# Patient Record
Sex: Female | Born: 1965 | Race: Black or African American | Hispanic: No | Smoking: Former smoker
Health system: Southern US, Community
[De-identification: ages and names within clinical notes are randomized; demographics above are authoritative.]

## PROBLEM LIST (undated history)

## (undated) DIAGNOSIS — T7840XA Allergy, unspecified, initial encounter: Secondary | ICD-10-CM

## (undated) DIAGNOSIS — M1990 Unspecified osteoarthritis, unspecified site: Secondary | ICD-10-CM

## (undated) DIAGNOSIS — F419 Anxiety disorder, unspecified: Secondary | ICD-10-CM

## (undated) DIAGNOSIS — IMO0002 Reserved for concepts with insufficient information to code with codable children: Secondary | ICD-10-CM

## (undated) DIAGNOSIS — M5136 Other intervertebral disc degeneration, lumbar region: Secondary | ICD-10-CM

## (undated) DIAGNOSIS — M797 Fibromyalgia: Secondary | ICD-10-CM

## (undated) HISTORY — DX: Other intervertebral disc degeneration, lumbar region: M51.36

## (undated) HISTORY — DX: Unspecified osteoarthritis, unspecified site: M19.90

## (undated) HISTORY — DX: Allergy, unspecified, initial encounter: T78.40XA

## (undated) HISTORY — DX: Anxiety disorder, unspecified: F41.9

## (undated) HISTORY — PX: COLONOSCOPY: SHX174

## (undated) HISTORY — PX: BUNIONECTOMY: SHX129

## (undated) HISTORY — PX: OTHER SURGICAL HISTORY: SHX169

## (undated) HISTORY — DX: Fibromyalgia: M79.7

## (undated) HISTORY — DX: Reserved for concepts with insufficient information to code with codable children: IMO0002

## (undated) HISTORY — PX: REDUCTION MAMMAPLASTY: SUR839

## (undated) HISTORY — PX: WISDOM TOOTH EXTRACTION: SHX21

## (undated) HISTORY — PX: TRIGGER FINGER RELEASE: SHX641

---
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
## 1983-06-08 DIAGNOSIS — IMO0002 Reserved for concepts with insufficient information to code with codable children: Secondary | ICD-10-CM

## 1983-06-08 HISTORY — DX: Reserved for concepts with insufficient information to code with codable children: IMO0002

## 1987-06-08 HISTORY — PX: TUBAL LIGATION: SHX77

## 1996-06-07 HISTORY — PX: BREAST REDUCTION SURGERY: SHX8

## 1996-06-07 HISTORY — PX: COSMETIC SURGERY: SHX468

## 2001-06-07 DIAGNOSIS — M797 Fibromyalgia: Secondary | ICD-10-CM

## 2001-06-07 HISTORY — DX: Fibromyalgia: M79.7

## 2002-06-07 HISTORY — PX: ABDOMINAL HYSTERECTOMY: SHX81

## 2013-12-06 ENCOUNTER — Other Ambulatory Visit: Payer: Self-pay | Admitting: Family Medicine

## 2013-12-06 DIAGNOSIS — Z1231 Encounter for screening mammogram for malignant neoplasm of breast: Secondary | ICD-10-CM

## 2013-12-20 ENCOUNTER — Ambulatory Visit: Payer: Self-pay

## 2014-01-09 ENCOUNTER — Ambulatory Visit
Admission: RE | Admit: 2014-01-09 | Discharge: 2014-01-09 | Disposition: A | Discharge status: Home / Self Care | Payer: Medicare HMO | Source: Ambulatory Visit | Attending: Family Medicine | Admitting: Family Medicine

## 2014-01-09 DIAGNOSIS — Z1231 Encounter for screening mammogram for malignant neoplasm of breast: Secondary | ICD-10-CM

## 2014-01-10 ENCOUNTER — Encounter: Payer: Self-pay | Admitting: Physician Assistant

## 2014-02-06 ENCOUNTER — Encounter: Payer: Self-pay | Admitting: Physician Assistant

## 2014-02-06 ENCOUNTER — Ambulatory Visit (INDEPENDENT_AMBULATORY_CARE_PROVIDER_SITE_OTHER): Payer: Medicare HMO | Admitting: Physician Assistant

## 2014-02-06 VITALS — BP 96/62 | HR 74 | Temp 98.2°F | Resp 14 | Ht 66.0 in | Wt 196.0 lb

## 2014-02-06 DIAGNOSIS — M5030 Other cervical disc degeneration, unspecified cervical region: Secondary | ICD-10-CM

## 2014-02-06 DIAGNOSIS — M5137 Other intervertebral disc degeneration, lumbosacral region: Secondary | ICD-10-CM

## 2014-02-06 DIAGNOSIS — K219 Gastro-esophageal reflux disease without esophagitis: Secondary | ICD-10-CM | POA: Insufficient documentation

## 2014-02-06 DIAGNOSIS — IMO0002 Reserved for concepts with insufficient information to code with codable children: Secondary | ICD-10-CM

## 2014-02-06 DIAGNOSIS — M5134 Other intervertebral disc degeneration, thoracic region: Secondary | ICD-10-CM | POA: Insufficient documentation

## 2014-02-06 DIAGNOSIS — Z9109 Other allergy status, other than to drugs and biological substances: Secondary | ICD-10-CM

## 2014-02-06 DIAGNOSIS — M1710 Unilateral primary osteoarthritis, unspecified knee: Secondary | ICD-10-CM

## 2014-02-06 DIAGNOSIS — Z889 Allergy status to unspecified drugs, medicaments and biological substances status: Secondary | ICD-10-CM | POA: Insufficient documentation

## 2014-02-06 DIAGNOSIS — M5136 Other intervertebral disc degeneration, lumbar region: Secondary | ICD-10-CM

## 2014-02-06 DIAGNOSIS — K5909 Other constipation: Secondary | ICD-10-CM

## 2014-02-06 DIAGNOSIS — K5900 Constipation, unspecified: Secondary | ICD-10-CM

## 2014-02-06 DIAGNOSIS — M1712 Unilateral primary osteoarthritis, left knee: Secondary | ICD-10-CM

## 2014-02-06 DIAGNOSIS — M1610 Unilateral primary osteoarthritis, unspecified hip: Secondary | ICD-10-CM

## 2014-02-06 DIAGNOSIS — M160 Bilateral primary osteoarthritis of hip: Secondary | ICD-10-CM

## 2014-02-06 HISTORY — DX: Other intervertebral disc degeneration, lumbar region: M51.36

## 2014-02-06 NOTE — Progress Notes (Signed)
Patient ID: Candice Shannon MRN: [id number], DOB: 1965/12/27, 48 y.o. Date of Encounter: @  Chief Complaint:  Chief Complaint  Patient presents with  . New pt - establish     HPI: 48 y.o. year old AA female  presents as a new patient to establish care with [place].  She reports that she is living in [place]. For the past 6 years she was back and forth between [place] and [place]. Her parents were living in [place]. She was going there to help care for her father who passed away in 2012/11/11. Since he passed away, patient and her mother have moved here. She says that she is living in an apartment with her mother. She says that she has an aunt who lives here. The patient has one child--- a daughter who has 5 children and one on the way. Patient states that the daughter and grandchildren are planning to move here in the next year.  When asked patient if she works she says " no", that she is "on disability".  Since moving to this area, she did go to see Dr. Manuela Neptune at [facility] for one visit on 12/06/13. She says that she is having to find a different primary care provider because of insurance.  At that visit with Dr.Jones, he did complete physical exam including full panel of lab work. Also he did x-ray of the hip and knee because she was complaining of left knee pain and right hip pain. Today patient did bring in a copy of that office visit note but we do not have a copy of the x-ray reports or the lab results. However the note does report that he was going to do CBC, CMP, lipid panel, TSH.  She also brings in a copy of the allergy testing done at the family urgent and family care. This shows positive results for multiple types of grasses, trees et Karie Soda.  She also brought in copy of office note with Dr. Elnoria Howard 01/09/14. She states that she did have colonoscopy with him. Was told to followup 10 years. I do not have the actual colonoscopy report.  Dr. Haywood Pao office  visit note does not provide much history or information regarding these diagnoses but does list other diagnoses for his visit as GERD and constipation.  She also brings in copy of recent mammogram done 01/11/14. This was negative and followup in 1 year.  She has no complaints or concerns to address today. Says that she simply wanted to establish care here.   Past Medical History  Diagnosis Date  . Allergy 1970  . Anxiety 1975  . Fibromyalgia 2003  . Arthritis 1977  . Ulcer 06/08/1983    Gastric Ulcer  . Degenerative disc disease, lumbar 02/06/2014     Home Meds: Outpatient Prescriptions Prior to Visit  Medication Sig Dispense Refill  . diphenhydrAMINE (BENADRYL) 25 MG tablet Take 25 mg by mouth every 6 (six) hours as needed.      . meloxicam (MOBIC) 15 MG tablet Take 15 mg by mouth daily.       No facility-administered medications prior to visit.    Allergies:  Allergies  Allergen Reactions  . Aspirin Nausea Only    History   Social History  . Marital Status: Divorced    Spouse Name: N/A    Number of Children: N/A  . Years of Education: N/A   Occupational History  . Not on file.   Social History  Main Topics  . Smoking status: Former Smoker    Quit date: 06/07/1986  . Smokeless tobacco: Never Used  . Alcohol Use: 1.1 oz/week    1 Glasses of wine, 1 Drinks containing 0.5 oz of alcohol per week  . Drug Use: No  . Sexual Activity: No   Other Topics Concern  . Not on file   Social History Narrative  . No narrative on file    Family History  Problem Relation Age of Onset  . Arthritis Mother   . Miscarriages / [place] Mother   . Diabetes Father   . Heart disease Father   . Hyperlipidemia Father   . COPD Maternal Grandmother   . Emphysema Maternal Grandmother   . Diabetes Paternal Grandmother   . Heart disease Paternal Grandmother   . Hyperlipidemia Paternal Grandmother   . Drug abuse Brother   . Diabetes Brother      Review of Systems:  See HPI for  pertinent ROS. All other ROS negative.    Physical Exam: Blood pressure 96/62, pulse 74, temperature 98.2 F (36.8 C), temperature source Oral, resp. rate 14, height  (1.676 m), weight 196 lb (88.905 kg)., Body mass index is 31.65 kg/(m^2). General: Overweight AAF. Appears in no acute distress. Neck: Supple. No thyromegaly. No lymphadenopathy. No carotid bruit. Lungs: Clear bilaterally to auscultation without wheezes, rales, or rhonchi. Breathing is unlabored. Heart: RRR with S1 S2. No murmurs, rubs, or gallops. Abdomen: Soft, non-tender, non-distended with normoactive bowel sounds. No hepatomegaly. No rebound/guarding. No obvious abdominal masses. Musculoskeletal:  Strength and tone normal for age. Extremities/Skin: Warm and dry.  No edema.  Neuro: Alert and oriented X 3. Moves all extremities spontaneously. Gait is normal. CNII-XII grossly in tact. Psych:  Responds to questions appropriately with a normal affect.     ASSESSMENT AND PLAN:  48 y.o. year old female with  1. Degenerative disc disease, cervical She reports that she has degenerative disc disease and chronic pain in her neck. This often causes severe headaches.  2. Degenerative disc disease, lumbar She says that she has ruptured disc in her lumbar spine. Says that in the past she did physical therapy.  3. Degenerative disc disease, thoracic She says that she has "compressed vertebrae" in her thoracic spine.  4. Arthritis of both hips She had recent x-rays by Dr. Knox Royalty. I do not have the report. He says that she was told that showed arthritis.  5. Arthritis of knee, left She had recent x-ray but Dr. Knox Royalty. I do not have report. She says that she was told it showed arthritis.  6. Gastroesophageal reflux disease, esophagitis presence not specified She says that in the past taking medications for this did not help much and seemed to just make matters worse. He says that she controls this is managing her  diet and avoiding certain foods.  7. Chronic constipation Dr. Elnoria Howard discussed this with her and she deferred any medication for this.  8. Multiple allergies She brought in copy of allergy testing done at the urgent and family care and it showed positive allergies to multiple grasses trees and cats and dogs et Karie Soda.  Today I had her sign a release for [place] to try to get copies of the lab results done at the family in urgent care She can wait one year for follow up for complete physical exam and otherwise can followup as needed in the interim.   [address] [place], [place], [facility] 02/06/2014 2:45 PM

## 2014-03-06 ENCOUNTER — Ambulatory Visit: Payer: Medicare HMO | Admitting: Physician Assistant

## 2014-03-08 ENCOUNTER — Encounter: Payer: Self-pay | Admitting: Family Medicine

## 2014-03-08 ENCOUNTER — Ambulatory Visit (INDEPENDENT_AMBULATORY_CARE_PROVIDER_SITE_OTHER): Payer: Medicare HMO | Admitting: Family Medicine

## 2014-03-08 VITALS — BP 98/58 | HR 80 | Temp 97.8°F | Resp 14 | Ht 66.0 in | Wt 197.0 lb

## 2014-03-08 DIAGNOSIS — N951 Menopausal and female climacteric states: Secondary | ICD-10-CM

## 2014-03-08 NOTE — Progress Notes (Signed)
   Subjective:    Patient ID: Candice Shannon, female    DOB: 04/02/1966, 48 y.o.   MRN: [id number]  HPI Patient is here today requesting laboratory assessment her hormone levels. Patient has a history of an abdominal hysterectomy however she still has her ovaries. She reports that she's been having hot flashes for several years. She attributes her difficulty losing weight possible hormone fluctuations in the body. She states that she is exercising on a daily basis burning 400-500 calories per day. She is restricting her diet to less than 1400 calories per day. Reportedly she has been unable to achieve any weight loss using these measures. I reviewed lab work obtained in urgent care in July which showed a normal TSH. Past Medical History  Diagnosis Date  . Allergy 1970  . Anxiety 1975  . Fibromyalgia 2003  . Arthritis 1977  . Ulcer 06/08/1983    Gastric Ulcer  . Degenerative disc disease, lumbar 02/06/2014   Past Surgical History  Procedure Laterality Date  . Cosmetic surgery Bilateral 1998    breast reduction  . Bunions Bilateral 1983 and 1993    both feet removed twice  . Bunionectomy Bilateral 1983, 1993  . Tubal ligation  06/08/1987  . Abdominal hysterectomy  2004    ovaries are not taken   No current outpatient prescriptions on file prior to visit.   No current facility-administered medications on file prior to visit.   Allergies  Allergen Reactions  . Aspirin Nausea Only   History   Social History  . Marital Status: Divorced    Spouse Name: N/A    Number of Children: N/A  . Years of Education: N/A   Occupational History  . Not on file.   Social History Main Topics  . Smoking status: Former Smoker    Quit date: 06/07/1986  . Smokeless tobacco: Never Used  . Alcohol Use: 1.1 oz/week    1 Glasses of wine, 1 Drinks containing 0.5 oz of alcohol per week  . Drug Use: No  . Sexual Activity: No   Other Topics Concern  . Not on file   Social History Narrative  . No  narrative on file      Review of Systems  All other systems reviewed and are negative.      Objective:   Physical Exam  Vitals reviewed. Cardiovascular: Normal rate and regular rhythm.   Pulmonary/Chest: Effort normal and breath sounds normal.          Assessment & Plan:  Menopause syndrome - Plan: Follicle stimulating hormone, Luteinizing hormone, Estrogens, total  I will gladly check the patient's FSH, LH, and estrogen levels. I explained to the patient that I do not believe estrogen deficiency as the cause of her weight issues. However if the patient has intermittent positives are tried hormone replacement therapy for hot flashes as well as some of the vasomotor symptoms.  We also discussed medications to help with weight loss including adipex, orlistat, belviq, and contrave.  She is interested in trying belviq.

## 2014-03-09 LAB — FOLLICLE STIMULATING HORMONE: FSH: 82.6 m[IU]/mL

## 2014-03-09 LAB — LUTEINIZING HORMONE: LH: 36.1 m[IU]/mL

## 2014-03-11 LAB — ESTROGENS, TOTAL: Estrogen: 48 pg/mL

## 2014-04-08 ENCOUNTER — Telehealth: Payer: Self-pay | Admitting: Physician Assistant

## 2014-04-08 NOTE — Telephone Encounter (Signed)
Patient would like rx for pain meds for her fibromyalgia if possible  Does not want to take lyrica  [phone] Would like neurotin if possible

## 2014-04-08 NOTE — Telephone Encounter (Signed)
Pt only been seen here twice.  Not on nay meds for fibromyalgia.  Called patient and had her schedule appt with provider.

## 2014-04-10 ENCOUNTER — Encounter: Payer: Self-pay | Admitting: Physician Assistant

## 2014-04-10 ENCOUNTER — Ambulatory Visit (INDEPENDENT_AMBULATORY_CARE_PROVIDER_SITE_OTHER): Payer: Medicare HMO | Admitting: Physician Assistant

## 2014-04-10 VITALS — BP 104/76 | HR 76 | Temp 98.2°F | Resp 18 | Wt 198.0 lb

## 2014-04-10 DIAGNOSIS — M5136 Other intervertebral disc degeneration, lumbar region: Secondary | ICD-10-CM

## 2014-04-10 DIAGNOSIS — M129 Arthropathy, unspecified: Secondary | ICD-10-CM

## 2014-04-10 DIAGNOSIS — M160 Bilateral primary osteoarthritis of hip: Secondary | ICD-10-CM

## 2014-04-10 DIAGNOSIS — M51369 Other intervertebral disc degeneration, lumbar region without mention of lumbar back pain or lower extremity pain: Secondary | ICD-10-CM

## 2014-04-10 DIAGNOSIS — M797 Fibromyalgia: Secondary | ICD-10-CM

## 2014-04-10 DIAGNOSIS — M1712 Unilateral primary osteoarthritis, left knee: Secondary | ICD-10-CM

## 2014-04-10 DIAGNOSIS — M5030 Other cervical disc degeneration, unspecified cervical region: Secondary | ICD-10-CM

## 2014-04-10 DIAGNOSIS — M5134 Other intervertebral disc degeneration, thoracic region: Secondary | ICD-10-CM

## 2014-04-10 MED ORDER — DULOXETINE HCL 60 MG PO CPEP: 60.0000 mg | ORAL_CAPSULE | Freq: Every day | ORAL | Status: DC

## 2014-04-10 MED ORDER — GABAPENTIN 300 MG PO CAPS: 300.0000 mg | ORAL_CAPSULE | Freq: Three times a day (TID) | ORAL | Status: DC

## 2014-04-10 NOTE — Progress Notes (Signed)
Patient ID: Candice Shannon MRN: [id number], DOB: 10/12/1965, 48 y.o. Date of Encounter: @DATE @  Chief Complaint:  Chief Complaint  Patient presents with  . Arthritis and Fibromyalgia    HPI: 48 y.o. year old AA female  Presents   She was seen by me on 02/06/2014  as a new patient to establish care with our office.  At that visit she reported:  She reported that [place] living in [place]. For the past 6 years [place]. Her parents were living in [place]. She was going there to help care for her father who passed away in June 2014.  Since he passed away, patient and her mother have moved here. She says that she is living in an apartment with her mother. She says that she has an aunt who lives here. The patient has one child--- a daughter who has 5 children and one on the way. Patient states that the daughter and grandchildren are planning to move here in the next year.  When asked patient if she works she said " no", that she is "on disability". Says she is on Disability for her fibromyalgia.  Since moving to this area, she did go [facility]rico [facility] one visit on 12/06/13. She says that she is having to find a different primary care provider because of insurance.  At that visit with Dr.Jones, he did complete physical exam including full panel of lab work. Also he did x-ray of the hip and knee because she was complaining of left knee pain and right hip pain. Today patient did bring in a copy of that office visit note but we do not have a copy of the x-ray reports or the lab results. However the note does report that he was going to do CBC, CMP, lipid panel, TSH.  At the initial OV she also brought in a copy of the allergy testing done at the family urgent and family care. This shows positive results for multiple types of grasses, trees et Karie Sodacetera.  She also brought in copy of office note with Dr. Elnoria HowardHung  01/09/14. She states that she did have colonoscopy with him. Was told to followup 10 years. I do not have the actual colonoscopy report.  Dr. Haywood PaoHung's office visit note does not provide much history or information regarding these diagnoses but does list other diagnoses for his visit as GERD and constipation.  She also brings in copy of recent mammogram done 01/11/14. This was negative and followup in 1 year.  On 02/06/2014 she had no complaints or concerns to address.  That day she simply wanted to establish care here.  TODAY---04/10/2014: Today she says that she is here to get on some medication for her fibromyalgia and arthritis. Says that some days she has to take 5 or 6 Naprosyn to try to control the pain even though she knows that isn't really safe. Says that she has been on multiple medicines for fibromyalgia in the past. Can recall being on gabapentin. Also says that she was on Lyrica for about 10 years but says that she does not want to restart that medicine. Also says that she was on Celebrex at one point. Says that she has recently been using her cousins diclofenac and gabapentin. Is interested in getting prescription for gabapentin. Says that Dr. Manuela NeptuneHenrico Jones had prescribed meloxicam but that that she saw no relief with that.  Past Medical History  Diagnosis Date  . Allergy 1970  . Anxiety 1975  . Fibromyalgia 2003  . Arthritis 1977  . Ulcer 06/08/1983    Gastric Ulcer  . Degenerative disc disease, lumbar 02/06/2014     Home Meds: No outpatient prescriptions prior to visit.   No facility-administered medications prior to visit.    Allergies:  Allergies  Allergen Reactions  . Aspirin Nausea Only    History   Social History  . Marital Status: Divorced    Spouse Name: N/A    Number of Children: N/A  . Years of Education: N/A   Occupational History  . Not on file.   Social History Main Topics  . Smoking status: Former Smoker    Quit date: 06/07/1986  . Smokeless  tobacco: Never Used  . Alcohol Use: 1.1 oz/week    1 Glasses of wine, 1 Not specified per week  . Drug Use: No  . Sexual Activity: No   Other Topics Concern  . Not on file   Social History Narrative    Family History  Problem Relation Age of Onset  . Arthritis Mother   . Miscarriages / [place] Mother   . Diabetes Father   . Heart disease Father   . Hyperlipidemia Father   . COPD Maternal Grandmother   . Emphysema Maternal Grandmother   . Diabetes Paternal Grandmother   . Heart disease Paternal Grandmother   . Hyperlipidemia Paternal Grandmother   . Drug abuse Brother   . Diabetes Brother      Review of Systems:  See HPI for pertinent ROS. All other ROS negative.    Physical Exam: Blood pressure 104/76, pulse 76, temperature 98.2 F (36.8 C), temperature source Oral, resp. rate 18, weight 198 lb (89.812 kg)., Body mass index is 31.97 kg/(m^2). General: Overweight AAF. Appears in no acute distress. Neck: Supple. No thyromegaly. No lymphadenopathy. No carotid bruit. Lungs: Clear bilaterally to auscultation without wheezes, rales, or rhonchi. Breathing is unlabored. Heart: RRR with S1 S2. No murmurs, rubs, or gallops. Musculoskeletal:  Strength and tone normal for age. Extremities/Skin: Warm and dry.  No edema.  Neuro: Alert and oriented X 3. Moves all extremities spontaneously. Gait is normal. CNII-XII grossly in tact. Psych:  Responds to questions appropriately with a normal affect.     ASSESSMENT AND PLAN:  48 y.o. year old female with  1. Degenerative disc disease, cervical She reports that she has degenerative disc disease and chronic pain in her neck. This often causes severe headaches.  2. Degenerative disc disease, lumbar She says that she has ruptured disc in her lumbar spine. Says that in the past she did physical therapy.  3. Degenerative disc disease, thoracic She says that she has "compressed vertebrae" in her thoracic spine.  4. Arthritis of both  hips She had recent x-rays by Dr. Knox Royalty. I do not have the report. He says that she was told that showed arthritis.  5. Arthritis of knee, left She had recent x-ray but Dr. Knox Royalty. I do not have report. She says that she was told it showed arthritis.  6. Gastroesophageal reflux disease, esophagitis presence not specified She says that in the past taking medications for this did not help much and seemed to just make matters worse. He says that she controls this is managing her diet and avoiding certain foods.  7. Chronic constipation Dr. Elnoria Howard discussed this with her and she deferred any medication for this.  8. Multiple allergies She  brought in copy of allergy testing done at the urgent and family care and it showed positive allergies to multiple grasses trees and cats and dogs et Karie Sodacetera.   1. Degenerative disc disease, cervical - gabapentin (NEURONTIN) 300 MG capsule; Take 1 capsule (300 mg total) by mouth 3 (three) times daily.  Dispense: 90 capsule; Refill: 3  2. Degenerative disc disease, lumbar - gabapentin (NEURONTIN) 300 MG capsule; Take 1 capsule (300 mg total) by mouth 3 (three) times daily.  Dispense: 90 capsule; Refill: 3  3. Degenerative disc disease, thoracic - gabapentin (NEURONTIN) 300 MG capsule; Take 1 capsule (300 mg total) by mouth 3 (three) times daily.  Dispense: 90 capsule; Refill: 3  4. Arthritis of both hips - gabapentin (NEURONTIN) 300 MG capsule; Take 1 capsule (300 mg total) by mouth 3 (three) times daily.  Dispense: 90 capsule; Refill: 3  5. Arthritis of knee, left - gabapentin (NEURONTIN) 300 MG capsule; Take 1 capsule (300 mg total) by mouth 3 (three) times daily.  Dispense: 90 capsule; Refill: 3  6. Fibromyalgia - gabapentin (NEURONTIN) 300 MG capsule; Take 1 capsule (300 mg total) by mouth 3 (three) times daily.  Dispense: 90 capsule; Refill: 3 - DULoxetine (CYMBALTA) 60 MG capsule; Take 1 capsule (60 mg total) by mouth daily.  Dispense: 30  capsule; Refill: 3  For the gabapentin I have written down for her and told her that first of all she is going to start taking only one pill at bedtime for 4 days. Then she can go up to taking 1 pill twice daily for 4 days. Then she can increase to one pill 3 times a day.  Discussed proper expectations of the Cymbalta with her. Told her that if she develops any adverse effects then call us. Otherwise, even if she is not noticing any beneficial effect than just continue taking the medicine for at least a couple months in order to see whether it does provide some benefit. Discussed with her that it takes weeks for this medication to buildup effect.  Can wait to have follow-up office visit in several months if doing well. Otherwise can follow-up sooner as needed.  Murray HodgkinsSigned, Mary Beth Chadds [place], [place], [facility] 04/10/2014 2:47 PM

## 2014-04-15 ENCOUNTER — Telehealth: Payer: Self-pay | Admitting: *Deleted

## 2014-04-15 NOTE — Telephone Encounter (Signed)
Spoke to patient.  She is aware of provider recommendations

## 2014-04-15 NOTE — Telephone Encounter (Signed)
Pt called stating that she has been taking Gabapentin and Cymbalta and now she is has been dizzy and wants to literally sleep all the time, wants to know what she needs to do. She says she has been taking them together. Please advise!

## 2014-04-15 NOTE — Telephone Encounter (Signed)
I recommend that she stop both medications. Let the symptoms resolve. Once the symptoms have resolved, then would start just the Cymbalta.  Stay on just Cymbalta alone for about one month then can try adding the gabapentin. If the symptoms recur in the meantime,  then call us back.

## 2014-09-06 ENCOUNTER — Telehealth: Payer: Self-pay | Admitting: Physician Assistant

## 2014-09-06 NOTE — Telephone Encounter (Signed)
[phone] PT has called this morning wanting something else for her pain. She states that she is not taking theDULoxetine (CYMBALTA) 60 MG capsule  anymore she is taking the gabapentin (NEURONTIN) 300 MG capsule She said she would like to have dix flox sodium tablet or cream called in ( i know the spelling is wrong) [employer]

## 2014-09-06 NOTE — Telephone Encounter (Signed)
Appt confirmed

## 2014-09-06 NOTE — Telephone Encounter (Signed)
Pt has not been seen since 11/15 and if medication changes are needed then she needs an OV - Leeroy BockChelsea will call her back to schedule this.

## 2014-09-10 ENCOUNTER — Encounter: Payer: Self-pay | Admitting: Family Medicine

## 2014-09-10 ENCOUNTER — Ambulatory Visit (INDEPENDENT_AMBULATORY_CARE_PROVIDER_SITE_OTHER): Payer: Medicare HMO | Admitting: Family Medicine

## 2014-09-10 VITALS — BP 100/68 | HR 68 | Temp 97.8°F | Resp 18 | Ht 66.0 in | Wt 204.0 lb

## 2014-09-10 DIAGNOSIS — M542 Cervicalgia: Secondary | ICD-10-CM

## 2014-09-10 DIAGNOSIS — M2550 Pain in unspecified joint: Secondary | ICD-10-CM

## 2014-09-10 DIAGNOSIS — M25561 Pain in right knee: Secondary | ICD-10-CM | POA: Diagnosis not present

## 2014-09-10 LAB — RHEUMATOID FACTOR: Rhuematoid fact SerPl-aCnc: <10 IU/mL (ref <=14)

## 2014-09-10 MED ORDER — DICLOFENAC SODIUM 75 MG PO TBEC
75.0000 mg | DELAYED_RELEASE_TABLET | Freq: Two times a day (BID) | ORAL | Status: DC
Start: 2014-09-10 — End: 2016-02-27

## 2014-09-10 MED ORDER — OMEPRAZOLE 20 MG PO CPDR: 20.0000 mg | DELAYED_RELEASE_CAPSULE | Freq: Every day | ORAL | Status: DC

## 2014-09-10 NOTE — Progress Notes (Signed)
Subjective:    Patient ID: Candice Shannon, female    DOB: 02/19/1966, 49 y.o.   MRN: [id number]  HPI  She is here today complaining of diffuse pain. She is also requesting a handicap placard. She is complaining of pain in her neck where she has severe degenerative disc disease and arthritis in her neck. She is also complaining of pain in her lower back. She is complaining of pain in both knees. The right knee is more severe than left knee. She has a history of severe osteoarthritis in the right knee according to the patient. She states that that has been present ever since she was age 89.  She has tried gabapentin, Cymbalta, Lyrica, meloxicam, and Celebrex without relief. She does have a history of fibromyalgia. She has been recommended to have cortisone injections in her knees due to the severity of the pain. She is requesting a handicap placard. I have no x-rays to review of her neck or her back or knees. Patient has used diclofenac gel at home with relief. She is requesting a pill form of diclofenac. She does have a history of ulcers. Therefore I am concerned about recurrent ulcers given chronic use of NSAIDs Past Medical History  Diagnosis Date  . Allergy 1970  . Anxiety 1975  . Fibromyalgia 2003  . Arthritis 1977  . Ulcer 06/08/1983    Gastric Ulcer  . Degenerative disc disease, lumbar 02/06/2014   Past Surgical History  Procedure Laterality Date  . Cosmetic surgery Bilateral 1998    breast reduction  . Bunions Bilateral 1983 and 1993    both feet removed twice  . Bunionectomy Bilateral 1983, 1993  . Tubal ligation  06/08/1987  . Abdominal hysterectomy  2004    ovaries are not taken   Current Outpatient Prescriptions on File Prior to Visit  Medication Sig Dispense Refill  . gabapentin (NEURONTIN) 300 MG capsule Take 1 capsule (300 mg total) by mouth 3 (three) times daily. 90 capsule 3   No current facility-administered medications on file prior to visit.   Allergies  Allergen  Reactions  . Aspirin Nausea Only   History   Social History  . Marital Status: Divorced    Spouse Name: N/A  . Number of Children: N/A  . Years of Education: N/A   Occupational History  . Not on file.   Social History Main Topics  . Smoking status: Former Smoker    Quit date: 06/07/1986  . Smokeless tobacco: Never Used  . Alcohol Use: 1.1 oz/week    1 Glasses of wine, 1 Standard drinks or equivalent per week  . Drug Use: No  . Sexual Activity: No   Other Topics Concern  . Not on file   Social History Narrative     Review of Systems  All other systems reviewed and are negative.      Objective:   Physical Exam  Cardiovascular: Normal rate, regular rhythm and normal heart sounds.   Pulmonary/Chest: Effort normal and breath sounds normal.  Musculoskeletal:       Right knee: She exhibits normal range of motion, no swelling, no effusion, no ecchymosis, no erythema, normal alignment, no LCL laxity, normal patellar mobility, no bony tenderness, normal meniscus and no MCL laxity. No medial joint line, no lateral joint line, no MCL, no LCL and no patellar tendon tenderness noted.       Left knee: She exhibits normal range of motion, no swelling, no effusion, no deformity, no laceration, no erythema, normal  alignment, no LCL laxity, normal patellar mobility, no bony tenderness, normal meniscus and no MCL laxity. No medial joint line, no lateral joint line, no MCL, no LCL and no patellar tendon tenderness noted.       Cervical back: She exhibits decreased range of motion.       Lumbar back: She exhibits decreased range of motion.  Vitals reviewed. patient is walking with a noticeable limp due to the pain in her right knee. Patient's pain is out of proportion to the exam today.        Assessment & Plan:  Knee pain, acute, right - Plan: DG Knee Complete 4 Views Right, DG Cervical Spine Complete  Neck pain - Plan: DG Cervical Spine Complete  Polyarthralgia - Plan:  Sedimentation rate, CBC with Differential/Platelet, ANA, Rheumatoid factor  Patient's history of severe arthritis since age 71 in numerous joints throughout her body does not mesh with a history of osteoarthritis. Therefore I'm going to obtain a CBC, sedimentation rate, rheumatoid factor, and an ANA to rule out autoimmune blood-borne arthritides. I will also obtain x-rays of the knee and the cervical spine. Her exam today does not suggest severe osteoarthritis. If the lab results are normal and the x-rays are normal, I believe the patient's pain is more accurately due to fibromyalgia and I would try to manage it accordingly. I will give the patient a prescription for diclofenac 75 mg by mouth twice a day but also recommended she use Prilosec 20 mg by mouth daily for gastrointestinal  prophylaxis

## 2014-09-11 LAB — CBC WITH DIFFERENTIAL/PLATELET
Basophils Absolute: 0 10*3/uL (ref 0.0–0.1)
Basophils Relative: 1 % (ref 0–1)
Eosinophils Absolute: 0.1 10*3/uL (ref 0.0–0.7)
Eosinophils Relative: 4 % (ref 0–5)
HCT: 38.8 % (ref 36.0–46.0)
Hemoglobin: 12.7 g/dL (ref 12.0–15.0)
Lymphocytes Relative: 48 % — ABNORMAL HIGH (ref 12–46)
Lymphs Abs: 1.7 10*3/uL (ref 0.7–4.0)
MCH: 27.2 pg (ref 26.0–34.0)
MCHC: 32.7 g/dL (ref 30.0–36.0)
MCV: 83.1 fL (ref 78.0–100.0)
MPV: 8.6 fL (ref 8.6–12.4)
Monocytes Absolute: 0.3 10*3/uL (ref 0.1–1.0)
Monocytes Relative: 9 % (ref 3–12)
Neutro Abs: 1.4 10*3/uL — ABNORMAL LOW (ref 1.7–7.7)
Neutrophils Relative %: 38 % — ABNORMAL LOW (ref 43–77)
Platelets: 338 10*3/uL (ref 150–400)
RBC: 4.67 MIL/uL (ref 3.87–5.11)
RDW: 15.7 % — ABNORMAL HIGH (ref 11.5–15.5)
WBC: 3.6 10*3/uL — ABNORMAL LOW (ref 4.0–10.5)

## 2014-09-11 LAB — ANA: Anti Nuclear Antibody(ANA): NEGATIVE

## 2014-09-11 LAB — SEDIMENTATION RATE: Sed Rate: 12 mm/hr (ref 0–20)

## 2014-09-12 ENCOUNTER — Ambulatory Visit
Admission: RE | Admit: 2014-09-12 | Discharge: 2014-09-12 | Disposition: A | Discharge status: Home / Self Care | Payer: Medicare HMO | Source: Ambulatory Visit | Attending: Family Medicine | Admitting: Family Medicine

## 2014-09-12 DIAGNOSIS — M542 Cervicalgia: Secondary | ICD-10-CM

## 2014-09-12 DIAGNOSIS — M25561 Pain in right knee: Secondary | ICD-10-CM

## 2014-09-16 ENCOUNTER — Other Ambulatory Visit: Payer: Self-pay | Admitting: Family Medicine

## 2014-09-16 MED ORDER — DICLOFENAC SODIUM 1 % TD GEL: 2.0000 g | Freq: Four times a day (QID) | TRANSDERMAL | Status: DC

## 2014-12-13 ENCOUNTER — Ambulatory Visit: Payer: Medicare HMO | Admitting: Family Medicine

## 2014-12-16 ENCOUNTER — Encounter: Payer: Self-pay | Admitting: Family Medicine

## 2014-12-16 ENCOUNTER — Ambulatory Visit (INDEPENDENT_AMBULATORY_CARE_PROVIDER_SITE_OTHER): Payer: Medicare HMO | Admitting: Family Medicine

## 2014-12-16 VITALS — BP 110/70 | HR 80 | Temp 97.9°F | Resp 16 | Ht 66.0 in | Wt 203.0 lb

## 2014-12-16 DIAGNOSIS — R1013 Epigastric pain: Secondary | ICD-10-CM

## 2014-12-16 MED ORDER — PANTOPRAZOLE SODIUM 40 MG PO TBEC: 40.0000 mg | DELAYED_RELEASE_TABLET | Freq: Two times a day (BID) | ORAL | Status: DC

## 2014-12-16 NOTE — Progress Notes (Signed)
Subjective:    Patient ID: Candice Shannon, female    DOB: 07/26/1965, 49 y.o.   MRN: [id number]  HPI  patient is here today complaining of several months of epigastric abdominal pain. The pain begins approximately one hour after eating. It is located right below the xiphoid process. It is sharp an intense in nature. It does not radiate. She denies any right upper quadrant abdominal pain.   She denies any fevers chills or melanoma. She denies any diarrhea or hematochezia. She denies any nausea or vomiting. She's been taking Prilosec with no benefit. She is not taking NSAIDs. She denies any dysuria or hematuria. She denies any vaginal bleeding. Past Medical History  Diagnosis Date  . Allergy 1970  . Anxiety 1975  . Fibromyalgia 2003  . Arthritis 1977  . Ulcer 06/08/1983    Gastric Ulcer  . Degenerative disc disease, lumbar 02/06/2014   Past Surgical History  Procedure Laterality Date  . Cosmetic surgery Bilateral 1998    breast reduction  . Bunions Bilateral 1983 and 1993    both feet removed twice  . Bunionectomy Bilateral 1983, 1993  . Tubal ligation  06/08/1987  . Abdominal hysterectomy  2004    ovaries are not taken   Current Outpatient Prescriptions on File Prior to Visit  Medication Sig Dispense Refill  . diclofenac (VOLTAREN) 75 MG EC tablet Take 1 tablet (75 mg total) by mouth 2 (two) times daily. 60 tablet 3  . diclofenac sodium (VOLTAREN) 1 % GEL Apply 2 g topically 4 (four) times daily. 100 g 5  . gabapentin (NEURONTIN) 300 MG capsule Take 1 capsule (300 mg total) by mouth 3 (three) times daily. 90 capsule 3  . omeprazole (PRILOSEC) 20 MG capsule Take 1 capsule (20 mg total) by mouth daily. 30 capsule 3   No current facility-administered medications on file prior to visit.   Allergies  Allergen Reactions  . Aspirin Nausea Only   History   Social History  . Marital Status: Divorced    Spouse Name: N/A  . Number of Children: N/A  . Years of Education: N/A    Occupational History  . Not on file.   Social History Main Topics  . Smoking status: Former Smoker    Quit date: 06/07/1986  . Smokeless tobacco: Never Used  . Alcohol Use: 1.1 oz/week    1 Glasses of wine, 1 Standard drinks or equivalent per week  . Drug Use: No  . Sexual Activity: No   Other Topics Concern  . Not on file   Social History Narrative      Review of Systems  All other systems reviewed and are negative.      Objective:   Physical Exam  Constitutional: She appears well-developed and well-nourished.  Cardiovascular: Normal rate, regular rhythm and normal heart sounds.   Pulmonary/Chest: Effort normal and breath sounds normal. No respiratory distress. She has no wheezes. She has no rales.  Abdominal: Soft. Bowel sounds are normal. She exhibits no distension and no mass. There is no tenderness. There is no rebound and no guarding.  Vitals reviewed.         Assessment & Plan:  Abdominal pain, epigastric - Plan: H. pylori breath test, CBC with Differential/Platelet, COMPLETE METABOLIC PANEL WITH GFR, Lipase, pantoprazole (PROTONIX) 40 MG tablet   I believe the patient's abdominal pain is  Related to her stomach. I believe she either has gastritis or possibly peptic ulcer disease. I will  Check a lipase to rule  out pancreatitis. I will check an H. Pylori breath test. I'll check a CBC as well as a CMP. Begin Protonix 40 mg by mouth twice a day recheck in 2 weeks. If no better consider a GI referral for an EGD. His symptoms migrate to the right upper quadrant consider an ultrasound to evaluate for gallstones.

## 2014-12-17 LAB — CBC WITH DIFFERENTIAL/PLATELET
Basophils Absolute: 0 10*3/uL (ref 0.0–0.1)
Basophils Relative: 0 % (ref 0–1)
Eosinophils Absolute: 0.1 10*3/uL (ref 0.0–0.7)
Eosinophils Relative: 3 % (ref 0–5)
HCT: 39 % (ref 36.0–46.0)
Hemoglobin: 13.1 g/dL (ref 12.0–15.0)
Lymphocytes Relative: 43 % (ref 12–46)
Lymphs Abs: 1.5 10*3/uL (ref 0.7–4.0)
MCH: 27.7 pg (ref 26.0–34.0)
MCHC: 33.6 g/dL (ref 30.0–36.0)
MCV: 82.5 fL (ref 78.0–100.0)
MPV: 9 fL (ref 8.6–12.4)
Monocytes Absolute: 0.3 10*3/uL (ref 0.1–1.0)
Monocytes Relative: 9 % (ref 3–12)
Neutro Abs: 1.6 10*3/uL — ABNORMAL LOW (ref 1.7–7.7)
Neutrophils Relative %: 45 % (ref 43–77)
Platelets: 337 10*3/uL (ref 150–400)
RBC: 4.73 MIL/uL (ref 3.87–5.11)
RDW: 15.6 % — ABNORMAL HIGH (ref 11.5–15.5)
WBC: 3.6 10*3/uL — ABNORMAL LOW (ref 4.0–10.5)

## 2014-12-17 LAB — COMPLETE METABOLIC PANEL WITH GFR
ALT: 12 U/L (ref 0–35)
AST: 18 U/L (ref 0–37)
Albumin: 4.3 g/dL (ref 3.5–5.2)
Alkaline Phosphatase: 61 U/L (ref 39–117)
BUN: 12 mg/dL (ref 6–23)
CO2: 29 mEq/L (ref 19–32)
Calcium: 10.2 mg/dL (ref 8.4–10.5)
Chloride: 99 mEq/L (ref 96–112)
Creat: 0.77 mg/dL (ref 0.50–1.10)
GFR, Est African American: >89 mL/min
GFR, Est Non African American: >89 mL/min
Glucose, Bld: 95 mg/dL (ref 70–99)
Potassium: 4.4 mEq/L (ref 3.5–5.3)
Sodium: 137 mEq/L (ref 135–145)
Total Bilirubin: 0.4 mg/dL (ref 0.2–1.2)
Total Protein: 7.9 g/dL (ref 6.0–8.3)

## 2014-12-17 LAB — LIPASE: Lipase: 62 U/L (ref 0–75)

## 2014-12-17 LAB — H. PYLORI BREATH TEST: H. pylori Breath Test: NOT DETECTED

## 2014-12-30 ENCOUNTER — Telehealth: Payer: Self-pay | Admitting: Physician Assistant

## 2014-12-30 DIAGNOSIS — R1013 Epigastric pain: Secondary | ICD-10-CM

## 2014-12-30 NOTE — Telephone Encounter (Signed)
Patient states that the medication pantoprazole 40 mg isn't working she states that she was taking these twice a day. She was told to call and let Dr. Tanya Nones know if this medication was working or not.

## 2014-12-30 NOTE — Telephone Encounter (Signed)
I would like to refer the patient to a gastroenterologist for possible EGD. Please schedule and arrange

## 2014-12-31 NOTE — Telephone Encounter (Signed)
Pt aware and referral placed.  

## 2015-01-06 ENCOUNTER — Other Ambulatory Visit: Payer: Self-pay | Admitting: Gastroenterology

## 2015-01-06 DIAGNOSIS — R1013 Epigastric pain: Secondary | ICD-10-CM

## 2015-01-09 ENCOUNTER — Ambulatory Visit
Admission: RE | Admit: 2015-01-09 | Discharge: 2015-01-09 | Disposition: A | Discharge status: Home / Self Care | Payer: Medicare HMO | Source: Ambulatory Visit | Attending: Gastroenterology | Admitting: Gastroenterology

## 2015-01-09 DIAGNOSIS — R1013 Epigastric pain: Secondary | ICD-10-CM

## 2015-01-30 ENCOUNTER — Other Ambulatory Visit: Payer: Self-pay | Admitting: Gastroenterology

## 2015-01-30 DIAGNOSIS — R110 Nausea: Secondary | ICD-10-CM

## 2015-02-07 ENCOUNTER — Encounter: Payer: Self-pay | Admitting: Physician Assistant

## 2015-02-13 ENCOUNTER — Encounter: Payer: Self-pay | Admitting: Family Medicine

## 2015-02-14 ENCOUNTER — Ambulatory Visit (HOSPITAL_COMMUNITY)
Admission: RE | Admit: 2015-02-14 | Discharge: 2015-02-14 | Disposition: A | Discharge status: Home / Self Care | Payer: Medicare HMO | Source: Ambulatory Visit | Attending: Gastroenterology | Admitting: Gastroenterology

## 2015-02-14 DIAGNOSIS — R110 Nausea: Secondary | ICD-10-CM | POA: Insufficient documentation

## 2015-02-14 MED ORDER — SINCALIDE 5 MCG IJ SOLR
0.0200 ug/kg | Freq: Once | INTRAMUSCULAR | Status: AC
  Administered 2015-02-14: 1.85 ug via INTRAVENOUS

## 2015-02-14 MED ORDER — TECHNETIUM TC 99M MEBROFENIN IV KIT
5.0000 | PACK | Freq: Once | INTRAVENOUS | Status: DC | PRN
  Administered 2015-02-14: 5.45 via INTRAVENOUS
  Filled 2015-02-14: qty 6

## 2015-02-14 MED ORDER — SINCALIDE 5 MCG IJ SOLR
INTRAMUSCULAR | Status: AC
  Administered 2015-02-14: 1.85 ug via INTRAVENOUS
  Filled 2015-02-14: qty 5

## 2015-02-14 MED ORDER — STERILE WATER FOR INJECTION IJ SOLN
INTRAMUSCULAR | Status: AC
  Filled 2015-02-14: qty 10

## 2015-03-27 ENCOUNTER — Ambulatory Visit (INDEPENDENT_AMBULATORY_CARE_PROVIDER_SITE_OTHER): Payer: Medicare HMO | Admitting: Family Medicine

## 2015-03-27 ENCOUNTER — Encounter: Payer: Self-pay | Admitting: Family Medicine

## 2015-03-27 VITALS — BP 100/60 | HR 68 | Temp 98.5°F | Resp 16 | Ht 66.0 in | Wt 202.0 lb

## 2015-03-27 DIAGNOSIS — B372 Candidiasis of skin and nail: Secondary | ICD-10-CM | POA: Diagnosis not present

## 2015-03-27 DIAGNOSIS — E669 Obesity, unspecified: Secondary | ICD-10-CM | POA: Diagnosis not present

## 2015-03-27 MED ORDER — CLOTRIMAZOLE-BETAMETHASONE 1-0.05 % EX CREA: 1.0000 "application " | TOPICAL_CREAM | Freq: Two times a day (BID) | CUTANEOUS | Status: DC

## 2015-03-27 MED ORDER — PHENTERMINE HCL 37.5 MG PO TABS: 37.5000 mg | ORAL_TABLET | Freq: Every day | ORAL | Status: DC

## 2015-03-27 NOTE — Progress Notes (Signed)
   Subjective:    Patient ID: Candice Shannon, female    DOB: 01/22/1966, 49 y.o.   MRN: [id number]  HPI  Patient has a pink erythematous rash in both axilla. It has been there approximately 1 week. It itches. The rash consists of a pink patch approximately 4 cm x 2 cm bilaterally. She is also requesting assistance with weight loss. She is eating 1400 cal a day and exercising 30 minutes 3 days a week with no benefit. She is unable to exercise more due to her knee pain. Past Medical History  Diagnosis Date  . Allergy 1970  . Anxiety 1975  . Fibromyalgia 2003  . Arthritis 1977  . Ulcer 06/08/1983    Gastric Ulcer  . Degenerative disc disease, lumbar 02/06/2014   Past Surgical History  Procedure Laterality Date  . Cosmetic surgery Bilateral 1998    breast reduction  . Bunions Bilateral 1983 and 1993    both feet removed twice  . Bunionectomy Bilateral 1983, 1993  . Tubal ligation  06/08/1987  . Abdominal hysterectomy  2004    ovaries are not taken   Current Outpatient Prescriptions on File Prior to Visit  Medication Sig Dispense Refill  . diclofenac (VOLTAREN) 75 MG EC tablet Take 1 tablet (75 mg total) by mouth 2 (two) times daily. 60 tablet 3  . diclofenac sodium (VOLTAREN) 1 % GEL Apply 2 g topically 4 (four) times daily. 100 g 5  . gabapentin (NEURONTIN) 300 MG capsule Take 1 capsule (300 mg total) by mouth 3 (three) times daily. 90 capsule 3   No current facility-administered medications on file prior to visit.   Allergies  Allergen Reactions  . Aspirin Nausea Only   Social History   Social History  . Marital Status: Divorced    Spouse Name: N/A  . Number of Children: N/A  . Years of Education: N/A   Occupational History  . Not on file.   Social History Main Topics  . Smoking status: Former Smoker    Quit date: 06/07/1986  . Smokeless tobacco: Never Used  . Alcohol Use: 1.1 oz/week    1 Glasses of wine, 1 Standard drinks or equivalent per week  . Drug Use: No  .  Sexual Activity: No   Other Topics Concern  . Not on file   Social History Narrative    Review of Systems  All other systems reviewed and are negative.      Objective:   Physical Exam  Constitutional: She appears well-developed and well-nourished.  Cardiovascular: Normal rate, regular rhythm and normal heart sounds.   No murmur heard. Pulmonary/Chest: Effort normal and breath sounds normal. No respiratory distress. She has no wheezes. She has no rales.  Skin: Rash noted. There is erythema.  Vitals reviewed.         Assessment & Plan:  Candidal intertrigo - Plan: clotrimazole-betamethasone (LOTRISONE) cream  Obesity - Plan: phentermine (ADIPEX-P) 37.5 MG tablet  Patient has Candida intertrigo. Begin Lotrisone apply twice a day for 1 week. I will give the patient phentermine 37.5 mg by mouth every morning for 3 months to help with weight loss.

## 2015-05-12 ENCOUNTER — Other Ambulatory Visit (HOSPITAL_COMMUNITY)
Admission: RE | Admit: 2015-05-12 | Discharge: 2015-05-12 | Disposition: A | Discharge status: Home / Self Care | Payer: Medicare HMO | Source: Ambulatory Visit | Attending: Gynecology | Admitting: Gynecology

## 2015-05-12 ENCOUNTER — Encounter: Payer: Self-pay | Admitting: Gynecology

## 2015-05-12 ENCOUNTER — Ambulatory Visit (INDEPENDENT_AMBULATORY_CARE_PROVIDER_SITE_OTHER): Payer: Medicare HMO | Admitting: Gynecology

## 2015-05-12 VITALS — BP 126/78 | Ht 66.0 in | Wt 201.0 lb

## 2015-05-12 DIAGNOSIS — Z01419 Encounter for gynecological examination (general) (routine) without abnormal findings: Secondary | ICD-10-CM

## 2015-05-12 DIAGNOSIS — Z1151 Encounter for screening for human papillomavirus (HPV): Secondary | ICD-10-CM | POA: Diagnosis present

## 2015-05-12 DIAGNOSIS — Z780 Asymptomatic menopausal state: Secondary | ICD-10-CM | POA: Diagnosis not present

## 2015-05-12 DIAGNOSIS — N942 Vaginismus: Secondary | ICD-10-CM | POA: Diagnosis not present

## 2015-05-12 NOTE — Progress Notes (Signed)
Candice FeeKimberly Shannon 06/05/1966 [id number]   History:    49 y.o.  for annual gyn exam who is a new patient to the practice. She is on disability as a result of her severe fibromyalgia and rheumatoid arthritis. Her PCP has been doing her blood work. Her vaccines are up-to-date but she declined the flu vaccine today. She stated she had a colonoscopy in 2015 and benign polyps were noted and she was instructed to follow-up for colonoscopy in 10 years. She stated that [place] a total abdominal hysterectomy as a result of symptomatic leiomyomatous uteri, menorrhagia and anemia. Patient stated that when she was much younger she had an abnormal Pap smear but no intervention only follow-up Pap smear and that it cleared back to normal.  Past medical history,surgical history, family history and social history were all reviewed and documented in the EPIC chart.  Gynecologic History No LMP recorded. Patient has had a hysterectomy. Contraception: status post hysterectomy Last Pap: Many years ago Results were: normal Last mammogram: 2015. Results were: normal  Obstetric History OB History  Gravida Para Term Preterm AB SAB TAB Ectopic Multiple Living  1 1        1     # Outcome Date GA Lbr Len/2nd Weight Sex Delivery Anes PTL Lv  1 Para                ROS: A ROS was performed and pertinent positives and negatives are included in the history.  GENERAL: No fevers or chills. HEENT: No change in vision, no earache, sore throat or sinus congestion. NECK: No pain or stiffness. CARDIOVASCULAR: No chest pain or pressure. No palpitations. PULMONARY: No shortness of breath, cough or wheeze. GASTROINTESTINAL: No abdominal pain, nausea, vomiting or diarrhea, melena or bright red blood per rectum. GENITOURINARY: No urinary frequency, urgency, hesitancy or dysuria. MUSCULOSKELETAL: No joint or muscle pain, no back pain, no recent trauma. DERMATOLOGIC: No rash, no itching, no lesions. ENDOCRINE: No  polyuria, polydipsia, no heat or cold intolerance. No recent change in weight. HEMATOLOGICAL: No anemia or easy bruising or bleeding. NEUROLOGIC: No headache, seizures, numbness, tingling or weakness. PSYCHIATRIC: No depression, no loss of interest in normal activity or change in sleep pattern.     Exam: chaperone present  BP 126/78 mmHg  Ht 5\' 6"  (1.676 m)  Wt 201 lb (91.173 kg)  BMI 32.46 kg/m2  Body mass index is 32.46 kg/(m^2).  General appearance : Well developed well nourished female. No acute distress HEENT: Eyes: no retinal hemorrhage or exudates,  Neck supple, trachea midline, no carotid bruits, no thyroidmegaly Lungs: Clear to auscultation, no rhonchi or wheezes, or rib retractions  Heart: Regular rate and rhythm, no murmurs or gallops Breast:Examined in sitting and supine position were symmetrical in appearance, no palpable masses or tenderness,  no skin retraction, no nipple inversion, no nipple discharge, no skin discoloration, no axillary or supraclavicular lymphadenopathy Abdomen: no palpable masses or tenderness, no rebound or guarding Extremities: no edema or skin discoloration or tenderness  Pelvic:  Bartholin, Urethra, Skene Glands: Within normal limits             Vagina: No gross lesions or discharge  Cervix: Absent  Uterus  vaginismus and tenderness making it difficult for a thorough pelvic exam  Adnexa  same as above  Anus and perineum  normal   Rectovaginal  normal sphincter tone without palpated masses or tenderness  Hemoccult cards provided     Assessment/Plan:  49 y.o. female for annual exam will return back to the office in 1-2 weeks for a more thorough pelvic exam to assess her adnexa due to her discomfort and vaginismus mated and incomplete exam today. A Pap smear was done today although we discussed the guidelines but since we do not have any report we will do 1 now and then follow the guidelines. She was provided with fecal Hemoccult cards  to submit to the office for testing. We discussed importance of monthly breast exam. We discussed importance of calcium vitamin D and regular exercise for osteoporosis prevention. She was provided with a requisition to schedule her mammogram. Patient declined flu vaccine today.   Ok Edwards MD, 2:41 PM 05/12/2015

## 2015-05-12 NOTE — Patient Instructions (Signed)

## 2015-05-14 LAB — CYTOLOGY - PAP

## 2015-06-11 ENCOUNTER — Encounter: Payer: Self-pay | Admitting: Gynecology

## 2015-06-11 ENCOUNTER — Ambulatory Visit (INDEPENDENT_AMBULATORY_CARE_PROVIDER_SITE_OTHER): Payer: Medicare Other | Admitting: Gynecology

## 2015-06-11 ENCOUNTER — Ambulatory Visit (INDEPENDENT_AMBULATORY_CARE_PROVIDER_SITE_OTHER): Payer: Medicare Other

## 2015-06-11 VITALS — BP 128/80

## 2015-06-11 DIAGNOSIS — N942 Vaginismus: Secondary | ICD-10-CM | POA: Diagnosis not present

## 2015-06-11 NOTE — Progress Notes (Signed)
   Patient is a 50 year old who was seen in the office on December 5 for her annual exam. She is a new patient to the practice. She has been on disability as a result of her severe fibromyalgia and rheumatoid arthritis. Her PCP has been doing her blood work. Because of the limited pelvic exam because of her vaginismus and tenderness throughout the pelvic exam she was asked to come back for an ultrasound and this is the reason for today's visit. Today's ultrasound demonstrated the following:  Ultrasound: Absent uterus (prior abdominal hysterectomy) Right ovary not seen. Excessive bowel activity noted. Left ovary normal. No fluid in the cul-de-sac. No adnexal masses.  Assessment/plan: Postmenopausal patient no hormone replacement therapy limited pelvic exam due to her vaginismus was asked to return to the office for ultrasound today. Ultrasound today since unremarkable with the exception of the right ovary was not seen and absent uterus from prior hysterectomy. Her recent Pap was normal and her PCP is doing her blood work. We'll see her back in one year or when necessary.

## 2015-06-18 ENCOUNTER — Other Ambulatory Visit: Payer: Self-pay

## 2015-06-18 DIAGNOSIS — Z1231 Encounter for screening mammogram for malignant neoplasm of breast: Secondary | ICD-10-CM

## 2015-06-23 ENCOUNTER — Ambulatory Visit
Admission: RE | Admit: 2015-06-23 | Discharge: 2015-06-23 | Disposition: A | Discharge status: Home / Self Care | Payer: Medicare Other | Source: Ambulatory Visit

## 2015-06-23 DIAGNOSIS — Z1231 Encounter for screening mammogram for malignant neoplasm of breast: Secondary | ICD-10-CM

## 2015-06-27 DIAGNOSIS — H16223 Keratoconjunctivitis sicca, not specified as Sjogren's, bilateral: Secondary | ICD-10-CM | POA: Diagnosis not present

## 2015-06-27 DIAGNOSIS — H04123 Dry eye syndrome of bilateral lacrimal glands: Secondary | ICD-10-CM | POA: Diagnosis not present

## 2015-07-11 ENCOUNTER — Other Ambulatory Visit: Payer: Medicare Other | Admitting: Anesthesiology

## 2015-07-11 DIAGNOSIS — K921 Melena: Secondary | ICD-10-CM

## 2015-07-11 DIAGNOSIS — Z1211 Encounter for screening for malignant neoplasm of colon: Secondary | ICD-10-CM

## 2015-07-17 ENCOUNTER — Other Ambulatory Visit: Payer: Self-pay | Admitting: Internal Medicine

## 2015-07-17 DIAGNOSIS — E2839 Other primary ovarian failure: Secondary | ICD-10-CM

## 2015-07-18 ENCOUNTER — Ambulatory Visit
Admission: RE | Admit: 2015-07-18 | Discharge: 2015-07-18 | Disposition: A | Discharge status: Home / Self Care | Payer: Medicare Other | Source: Ambulatory Visit | Attending: Internal Medicine | Admitting: Internal Medicine

## 2015-07-18 DIAGNOSIS — E2839 Other primary ovarian failure: Secondary | ICD-10-CM

## 2016-02-27 ENCOUNTER — Other Ambulatory Visit: Payer: Self-pay | Admitting: Family Medicine

## 2016-02-27 DIAGNOSIS — M542 Cervicalgia: Secondary | ICD-10-CM

## 2016-02-27 DIAGNOSIS — M2550 Pain in unspecified joint: Secondary | ICD-10-CM

## 2016-02-27 DIAGNOSIS — M25561 Pain in right knee: Secondary | ICD-10-CM

## 2016-02-27 NOTE — Telephone Encounter (Signed)
RX filled per protocol 

## 2016-07-06 ENCOUNTER — Other Ambulatory Visit: Payer: Self-pay | Admitting: Internal Medicine

## 2016-07-06 DIAGNOSIS — Z9889 Other specified postprocedural states: Secondary | ICD-10-CM

## 2016-07-06 DIAGNOSIS — Z1231 Encounter for screening mammogram for malignant neoplasm of breast: Secondary | ICD-10-CM

## 2016-07-13 ENCOUNTER — Ambulatory Visit
Admission: RE | Admit: 2016-07-13 | Discharge: 2016-07-13 | Disposition: A | Discharge status: Home / Self Care | Payer: Medicare Other | Source: Ambulatory Visit | Attending: Internal Medicine | Admitting: Internal Medicine

## 2016-07-13 DIAGNOSIS — Z1231 Encounter for screening mammogram for malignant neoplasm of breast: Secondary | ICD-10-CM

## 2016-07-13 DIAGNOSIS — Z9889 Other specified postprocedural states: Secondary | ICD-10-CM

## 2016-10-20 ENCOUNTER — Encounter: Payer: Self-pay | Admitting: Gynecology

## 2016-11-03 ENCOUNTER — Encounter (HOSPITAL_COMMUNITY): Payer: Self-pay | Admitting: Emergency Medicine

## 2016-11-03 ENCOUNTER — Emergency Department (HOSPITAL_COMMUNITY)
Admission: EM | Admit: 2016-11-03 | Discharge: 2016-11-03 | Disposition: A | Discharge status: Home / Self Care | Payer: Medicare Other | Attending: Emergency Medicine | Admitting: Emergency Medicine

## 2016-11-03 DIAGNOSIS — R21 Rash and other nonspecific skin eruption: Secondary | ICD-10-CM | POA: Diagnosis not present

## 2016-11-03 DIAGNOSIS — Z79899 Other long term (current) drug therapy: Secondary | ICD-10-CM | POA: Diagnosis not present

## 2016-11-03 DIAGNOSIS — Z87891 Personal history of nicotine dependence: Secondary | ICD-10-CM | POA: Diagnosis not present

## 2016-11-03 NOTE — ED Provider Notes (Signed)
[facility]-EMERGENCY DEPT Provider Note   CSN: [id number] Arrival date & time: 11/03/16  1423   By signing my name below, I, Soijett Blue, attest that this documentation has been prepared under the direction and in the presence of Kerrie Buffalo, NP Electronically Signed: Soijett Blue, ED Scribe. 11/03/16. 4:50 PM.  History   Chief Complaint Chief Complaint  Patient presents with  . Rash    HPI Candice Shannon is a 51 y.o. female who presents to the Emergency Department complaining of recurrent pruritic rash to bilateral axillas left > right onset 2-3 year ago. Pt reports associated minimal drainage from affected areas and redness to the affected areas. She notes that she was evaluated by her PCP and prescribed a combination of cortisone and yeast cream which did not help so sent to dermatology and prescribed stronger steroid cream that did help some. Pt reports that she hasn't returned to the dermatologist. Denies shaving her bilateral axillas. Denies cough, wheezing, SOB, and any other symptoms.    The history is provided by the patient and a relative. No language interpreter was used.  Rash   This is a recurrent problem. Episode onset: 2-3 years. The problem has been gradually worsening. The problem is associated with nothing. Affected Location: bilateral axillas. The pain is at a severity of 0/10. The patient is experiencing no pain. The pain has been constant since onset. Associated symptoms include itching and weeping. Pertinent negatives include no pain. Treatments tried: prescribed steroid creams. The treatment provided mild relief.    Past Medical History:  Diagnosis Date  . Allergy 1970  . Anxiety 1975  . Arthritis 1977  . Degenerative disc disease, lumbar 02/06/2014  . Fibromyalgia 2003  . Ulcer 06/08/1983   Gastric Ulcer    Patient Active Problem List   Diagnosis Date Noted  . Fibromyalgia 04/10/2014  . 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 Degenerative disc disease,  lumbar 02/06/2014  . 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 Arthritis of both hips 02/06/2014  . 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 GERD (gastroesophageal reflux disease) 02/06/2014  . Chronic constipation 02/06/2014  . Multiple allergies 02/06/2014    Past Surgical History:  Procedure Laterality Date  . ABDOMINAL HYSTERECTOMY  2004   ovaries are not taken  . BREAST REDUCTION SURGERY Bilateral 1998  . BUNIONECTOMY Bilateral 1983, 1993  . bunions Bilateral 1983 and 1993   both feet removed twice  . COSMETIC SURGERY Bilateral 1998   breast reduction  . TUBAL LIGATION  06/08/1987  . WISDOM TOOTH EXTRACTION      OB History    Gravida Para Term Preterm AB Living   1 1       1    SAB TAB Ectopic Multiple Live Births                   Home Medications    Prior to Admission medications   Medication Sig Start Date End Date Taking? Authorizing Provider  clotrimazole-betamethasone (LOTRISONE) cream Apply 1 application topically 2 (two) times daily. 03/27/15   Donita Brooks, MD  diclofenac (VOLTAREN) 75 MG EC tablet take 1 tablet by mouth twice a day 02/27/16   Donita Brooks, MD  gabapentin (NEURONTIN) 300 MG capsule Take 1 capsule (300 mg total) by mouth 3 (three) times daily. 04/10/14   Dorena Bodo, PA-C  phentermine (ADIPEX-P) 37.5 MG tablet Take 1 tablet (37.5 mg total) by mouth daily before breakfast. 03/27/15   Donita Brooks, MD  VOLTAREN 1 % GEL APPLY 2 GRAMS TO AFFECTED AREA 4 TIMES A DAY 02/27/16   Donita BrooksPickard, Warren T, MD    Family History Family History  Problem Relation Age of Onset  . Arthritis Mother   . Miscarriages / [place] Mother   . Diabetes Father   . Heart disease Father   . Hyperlipidemia Father   . COPD Maternal Grandmother   . Emphysema Maternal Grandmother   . Diabetes Paternal Grandmother   . Heart disease Paternal Grandmother   . Hyperlipidemia Paternal Grandmother   . Drug abuse Brother   . Diabetes Brother     Social  History Social History  Substance Use Topics  . Smoking status: Former Smoker    Quit date: 06/07/1986  . Smokeless tobacco: Never Used  . Alcohol use 1.2 oz/week    1 Glasses of wine, 1 Standard drinks or equivalent per week     Allergies   Aspirin   Review of Systems Review of Systems  HENT: Negative.   Respiratory: Negative for cough, shortness of breath and wheezing.   Musculoskeletal: Negative for joint swelling.  Skin: Positive for color change (redness to the affected areas), itching and rash (bilateral axillas, left > right).     Physical Exam Updated Vital Signs BP 122/79 (BP Location: Right Arm)   Pulse 89   Temp 98.1 F (36.7 C) (Oral)   Resp 18   SpO2 98%   Physical Exam  Constitutional: She is oriented to person, place, and time. She appears well-developed and well-nourished. No distress.  HENT:  Head: Normocephalic and atraumatic.  Right Ear: Tympanic membrane and ear canal normal.  Left Ear: Tympanic membrane and ear canal normal.  Mouth/Throat: Uvula is midline, oropharynx is clear and moist and mucous membranes are normal. No posterior oropharyngeal edema or posterior oropharyngeal erythema.  Eyes: EOM are normal.  Neck: Neck supple.  Cardiovascular: Normal rate and regular rhythm.   Pulmonary/Chest: Effort normal and breath sounds normal.  Musculoskeletal: Normal range of motion.  Lymphadenopathy:    She has no cervical adenopathy.  Neurological: She is alert and oriented to person, place, and time.  Skin: Skin is warm and dry. There is erythema.  Erythematous, raised, areas to bilateral axilla, left worse than right. There is one small area that is open with a small amount of oozing. No red streaking. Does not appear infected.  Psychiatric: She has a normal mood and affect. Her behavior is normal.  Nursing note and vitals reviewed.    ED Treatments / Results  DIAGNOSTIC STUDIES: Oxygen Saturation is 98% on RA, nl by my interpretation.     COORDINATION OF CARE: 4:49 PM Discussed treatment plan with pt at bedside which includes consult with attending and pt agreed to plan.  4:37 PM- Went to evaluate pt who was not in the room.    Labs (all labs ordered are listed, but only abnormal results are displayed) Labs Reviewed - No data to display  Radiology No results found.  Procedures Procedures (including critical care time)  Medications Ordered in ED Medications - No data to display Dr. Deretha EmoryZackowski in to examine the patient and agrees with A/P.   Initial Impression / Assessment and Plan / ED Course  I have reviewed the triage vital signs and the nursing notes.  Final Clinical Impressions(s) / ED Diagnoses  Patient with rash and itching bilateral axilla stable for d/c without fever or signs of infection. Discussed possible allergic dermatitis vs monilia vs viral or other  causes. Patient to continue the cortisone cream and f/u with dermatology.  Final diagnoses:  Rash and nonspecific skin eruption    New Prescriptions New Prescriptions   No medications on file  I personally performed the services described in this documentation, which was scribed in my presence. The recorded information has been reviewed and is accurate.    Kerrie Buffalo [place], [place] 11/03/16 1734    Vanetta Mulders, MD 11/04/16 1131

## 2016-11-03 NOTE — ED Triage Notes (Signed)
Pt sts rash in bilateral axillary area x years

## 2016-11-03 NOTE — Discharge Instructions (Signed)
Use the steroid cream that you have. Make an appointment in Dr. Scharlene GlossHall's office for follow up. Take Benadryl as needed for itching.

## 2016-11-03 NOTE — ED Provider Notes (Signed)
Medical screening examination/treatment/procedure(s) were conducted as a shared visit with non-physician practitioner(s) and myself.  I personally evaluated the patient during the encounter.   EKG Interpretation None      Patient does seem by me along with the physician assistant. Patient with a 3 year history of bilateral axillary rash skin lesions. Patient seen by primary care doctor in the past and treated as if it could be a fungal yeast infection. Without any improvement. Patient then not get a new primary care doctor and he referred her to dermatology. Dermatology was unable to explain what was treated with a stronger steroid cream which patient used for 7 days and then occasionally when necessary. A stronger steroid cream improves it some but does not resolve it. Patient denies any other any underarm products other than olive oil and Lavender.  The rash has a component somewhat suggestive of allergic type reaction. It will open and weepy at times. Does not seem to be distinctly yeast related. Recommending follow-up well with dermatology. In the meantime have her use the stronger steroid cream that has worked some in the past.   Vanetta MuldersZackowski, Scott, MD 11/03/16 1731

## 2017-04-09 IMAGING — US US ABDOMEN LIMITED
1 series · 14 of 25 positions shown · non-contrast
Comparison: None.

CLINICAL DATA: Epigastric abdominal pain.  Nausea.

EXAM:
US ABDOMEN LIMITED - RIGHT UPPER QUADRANT

[Series 1: us abdomen limited · 0.32mm/px · 14 of 38 slices shown]
[im 1/38]
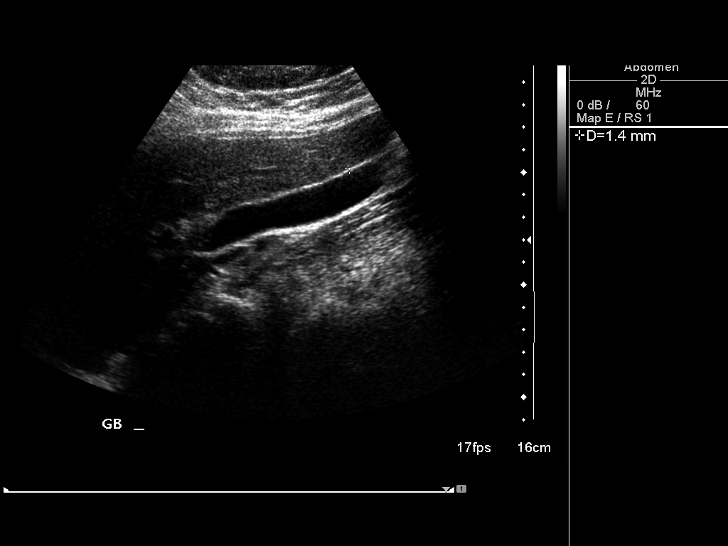
[im 4/38]
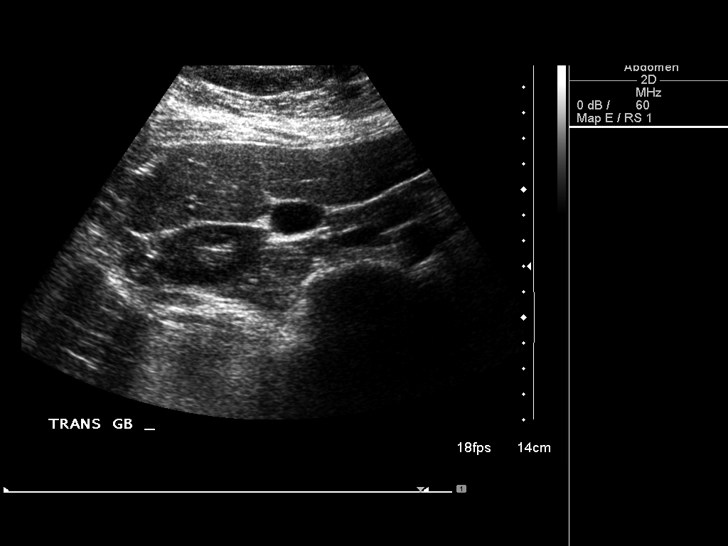
[im 7/38]
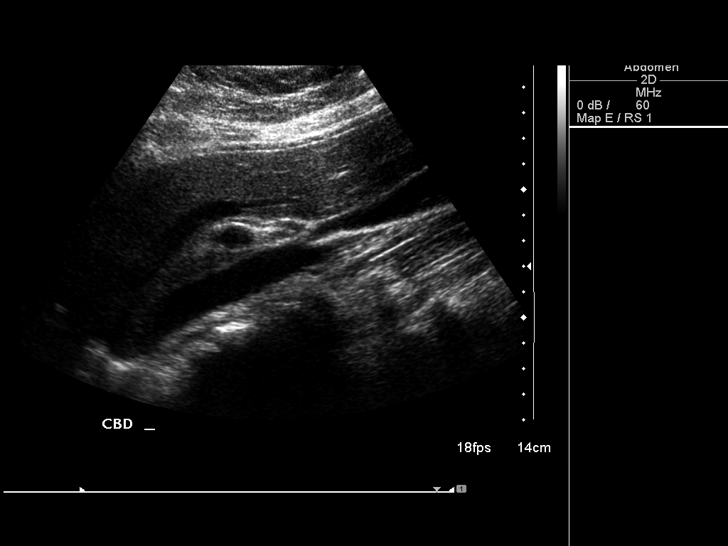
[im 10/38]
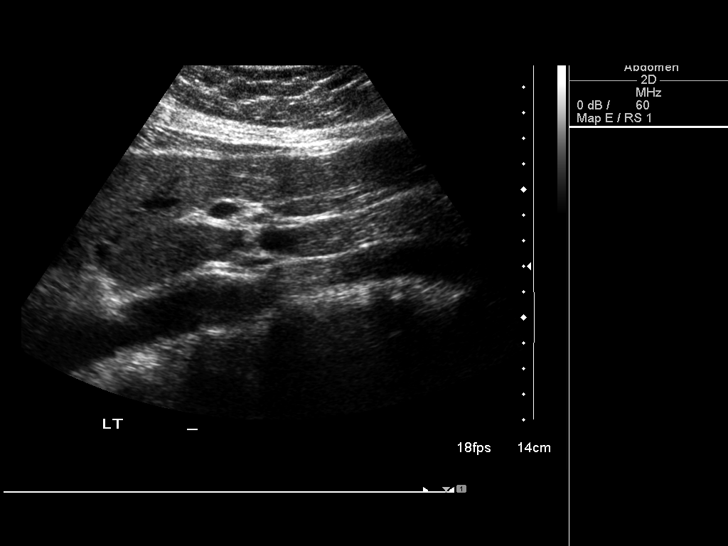
[im 13/38]
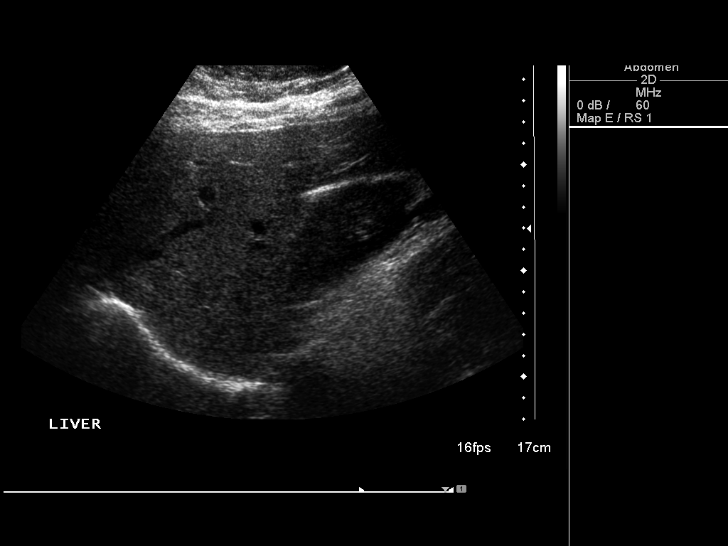
[im 14/38]
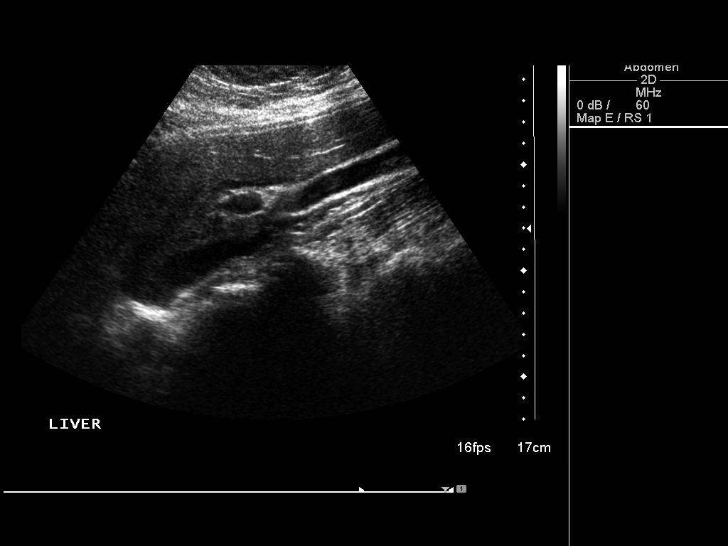
[im 17/38]
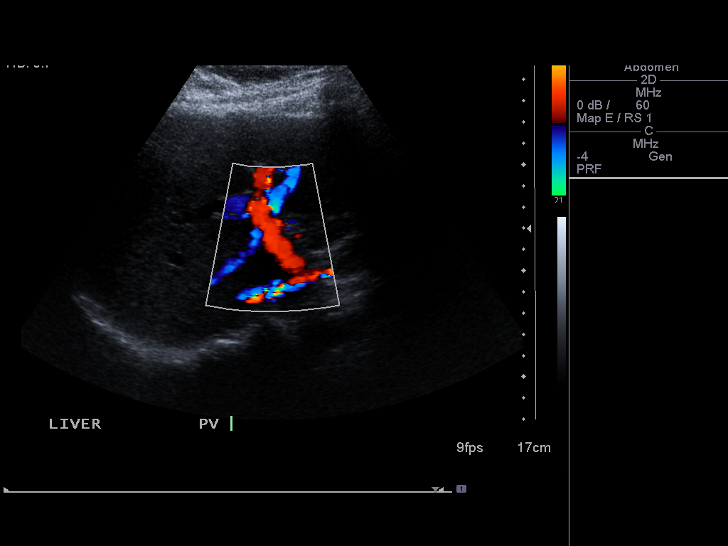
[im 21/38]
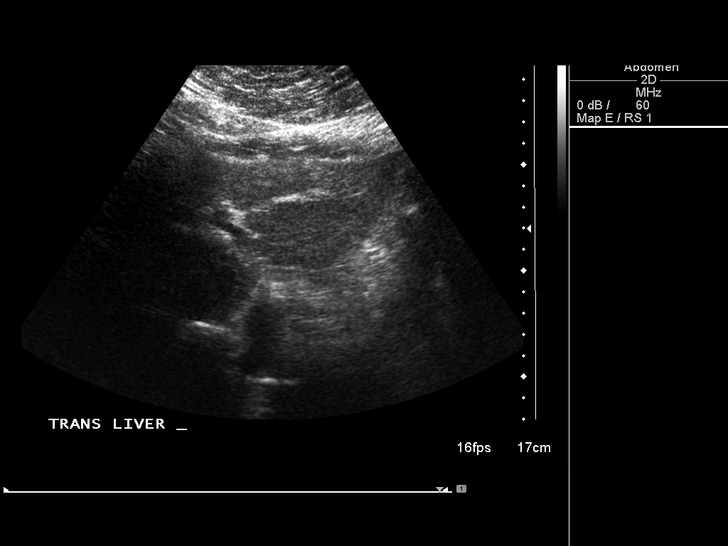
[im 24/38]
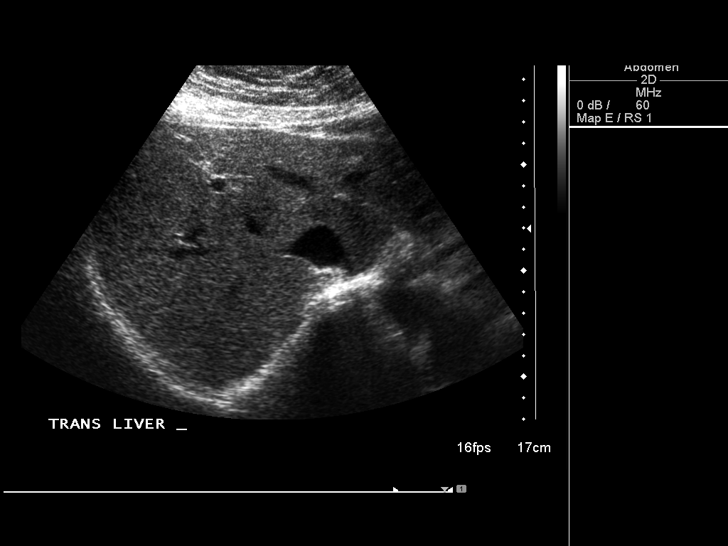
[im 25/38]
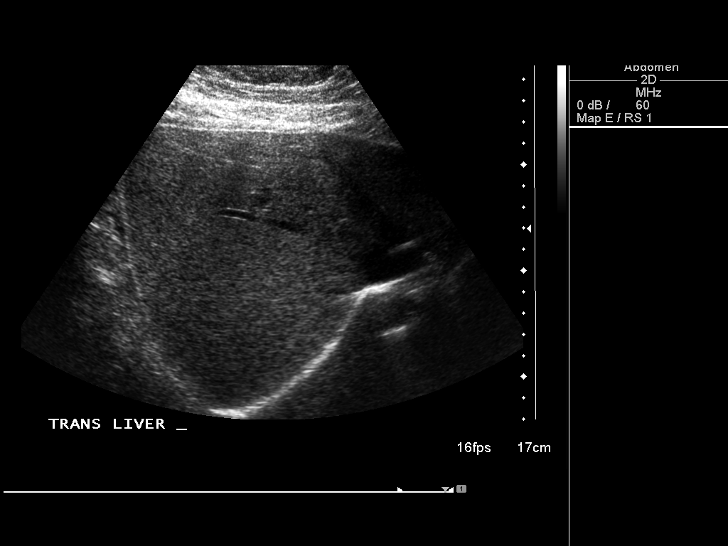
[im 28/38]
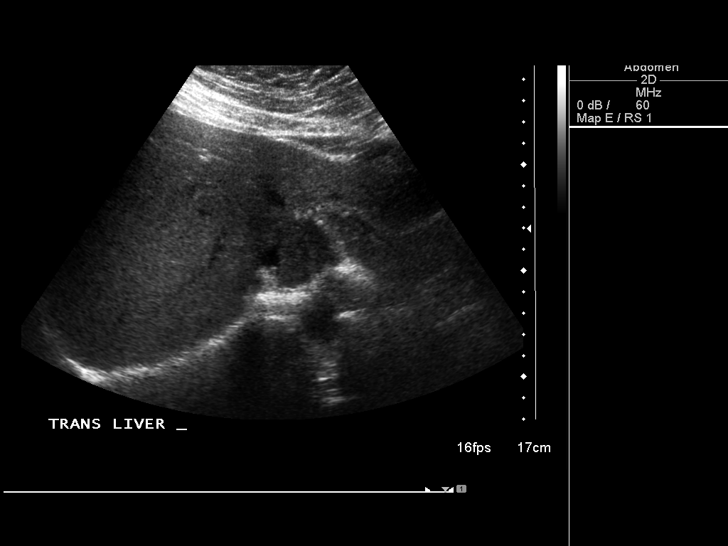
[im 31/38]
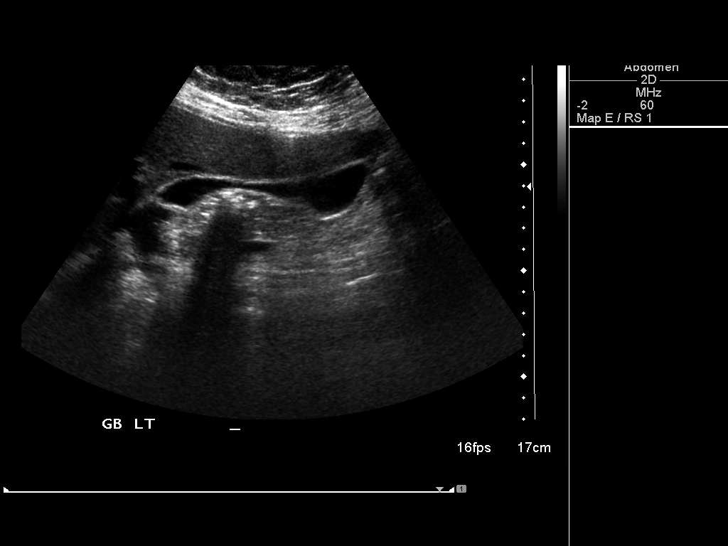
[im 34/38]
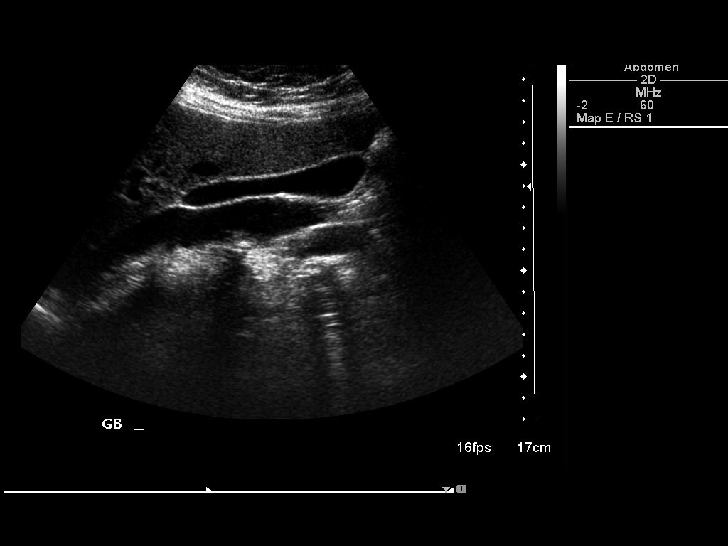
[im 38/38]
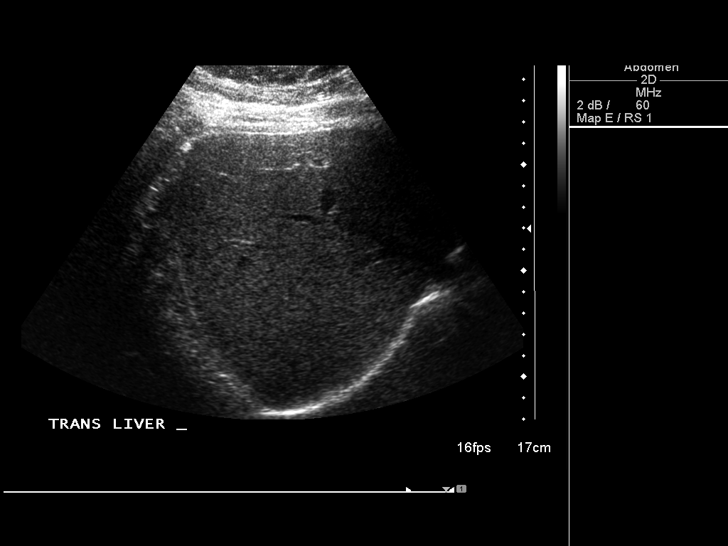

[14 of 25 positions shown; findings below may reference images not displayed]

FINDINGS: Gallbladder:

Gallbladder has a normal appearance. Gallbladder wall is 1.4 mm,
within normal limits. No stones or pericholecystic fluid. No
sonographic Murphy's sign.

Common bile duct:

Diameter: 4.7 mm

Liver:

No focal lesion identified. Within normal limits in parenchymal
echogenicity.
IMPRESSION: Normal evaluation of the right upper quadrant.

## 2017-07-04 ENCOUNTER — Other Ambulatory Visit: Payer: Self-pay | Admitting: Internal Medicine

## 2017-07-04 DIAGNOSIS — Z1231 Encounter for screening mammogram for malignant neoplasm of breast: Secondary | ICD-10-CM

## 2017-07-26 ENCOUNTER — Ambulatory Visit
Admission: RE | Admit: 2017-07-26 | Discharge: 2017-07-26 | Disposition: A | Discharge status: Home / Self Care | Payer: Medicare Other | Source: Ambulatory Visit | Attending: Internal Medicine | Admitting: Internal Medicine

## 2017-07-26 DIAGNOSIS — Z1231 Encounter for screening mammogram for malignant neoplasm of breast: Secondary | ICD-10-CM

## 2018-06-21 ENCOUNTER — Other Ambulatory Visit: Payer: Self-pay | Admitting: Internal Medicine

## 2018-06-21 DIAGNOSIS — Z1231 Encounter for screening mammogram for malignant neoplasm of breast: Secondary | ICD-10-CM

## 2018-07-27 ENCOUNTER — Ambulatory Visit
Admission: RE | Admit: 2018-07-27 | Discharge: 2018-07-27 | Disposition: A | Discharge status: Home / Self Care | Payer: Medicare Other | Source: Ambulatory Visit | Attending: Internal Medicine | Admitting: Internal Medicine

## 2018-07-27 DIAGNOSIS — Z1231 Encounter for screening mammogram for malignant neoplasm of breast: Secondary | ICD-10-CM

## 2019-08-18 ENCOUNTER — Other Ambulatory Visit: Payer: Self-pay | Admitting: Internal Medicine

## 2019-08-18 DIAGNOSIS — E2839 Other primary ovarian failure: Secondary | ICD-10-CM

## 2019-08-18 DIAGNOSIS — Z1231 Encounter for screening mammogram for malignant neoplasm of breast: Secondary | ICD-10-CM

## 2019-09-06 HISTORY — PX: DE QUERVAIN'S RELEASE: SHX1439

## 2019-09-07 ENCOUNTER — Ambulatory Visit: Payer: Medicare Other

## 2019-09-07 ENCOUNTER — Other Ambulatory Visit: Payer: Self-pay

## 2019-09-07 ENCOUNTER — Ambulatory Visit
Admission: RE | Admit: 2019-09-07 | Discharge: 2019-09-07 | Disposition: A | Discharge status: Home / Self Care | Payer: Medicare Other | Source: Ambulatory Visit | Attending: Internal Medicine | Admitting: Internal Medicine

## 2019-09-07 ENCOUNTER — Ambulatory Visit
Admission: RE | Admit: 2019-09-07 | Discharge: 2019-09-07 | Disposition: A | Discharge status: Home / Self Care | Payer: Medicare HMO | Source: Ambulatory Visit | Attending: Internal Medicine | Admitting: Internal Medicine

## 2019-09-07 DIAGNOSIS — Z1231 Encounter for screening mammogram for malignant neoplasm of breast: Secondary | ICD-10-CM

## 2019-09-07 DIAGNOSIS — E2839 Other primary ovarian failure: Secondary | ICD-10-CM

## 2019-09-11 ENCOUNTER — Ambulatory Visit: Payer: Medicare HMO | Admitting: Dermatology

## 2019-09-11 ENCOUNTER — Encounter: Payer: Self-pay | Admitting: Dermatology

## 2019-09-11 ENCOUNTER — Other Ambulatory Visit: Payer: Self-pay

## 2019-09-11 DIAGNOSIS — L309 Dermatitis, unspecified: Secondary | ICD-10-CM | POA: Diagnosis not present

## 2019-09-11 NOTE — Patient Instructions (Signed)
Patient is an excellent historian who relates a history of recurring rash limited to underneath the arms which is triggered seasonally in warmer weather.  Her primary care doctor tried topical mometasone without benefit.  It is perfectly clear when there is not an active episode.  She provided photographs which showed edematous hive-like lesions under each underarm.  Today the skin is perfectly normal there is nothing visible nor palpable.  This does not historically fit a contact dermatitis or hidradenitis.  The differential should include a fixed drug eruption, erythrasma, or an endogenous intertrigo.  Initially I have suggested that Candice Shannon try Zeasorb A/F.; if she cannot find this locally, it may be available at [employer].  If she cannot find this, she will call me and I will write a substitute.  This will be used daily for the foreseeable future.  Should this fail, I will try to add her on as soon as a flare occurs (she has my cell phone number).

## 2019-09-12 ENCOUNTER — Encounter: Payer: Self-pay | Admitting: Dermatology

## 2019-09-12 NOTE — Progress Notes (Signed)
   New Patient   Subjective  Candice Shannon is a 54 y.o. female who presents for the following: Rash (under arm-gone now that its time for my appointment- itched and burned--used glycerin and aloe and it went away).  Rash Location: Underarms Duration: 1 year Quality: Comes and goes Associated Signs/Symptoms: Burning itch Modifying Factors: No known triggers Severity: When present, moderately severe. Timing: Context:    The following portions of the chart were reviewed this encounter and updated as appropriate:     Objective  Well appearing patient in no apparent distress; mood and affect are within normal limits.  A focused examination was performed including face, upper arms, underarms + outer chest. Relevant physical exam findings are noted in the Assessment and Plan. Excellent historian. Photos show 5-14mm patches of subacute dermatitis axillae. Never drains, no obvious blisters. Differential includes infection (erythrasma or, less likely, candida), fixed "drug" eruptions, contact dermatitis. I'll try to add her on with next flare.  Assessment & Plan  Dermatitis (2) Left Axilla; Right Axilla  Will try otc Zeasorb AF daily to see if this will prevent/minimize flares.

## 2020-03-20 ENCOUNTER — Encounter: Payer: Self-pay | Admitting: Allergy & Immunology

## 2020-03-20 ENCOUNTER — Other Ambulatory Visit: Payer: Self-pay

## 2020-03-20 ENCOUNTER — Ambulatory Visit: Payer: Medicare HMO | Admitting: Allergy & Immunology

## 2020-03-20 ENCOUNTER — Telehealth: Payer: Self-pay

## 2020-03-20 VITALS — BP 120/80 | HR 50 | Temp 97.3°F | Resp 14 | Ht 66.0 in | Wt 191.6 lb

## 2020-03-20 DIAGNOSIS — J302 Other seasonal allergic rhinitis: Secondary | ICD-10-CM

## 2020-03-20 DIAGNOSIS — T781XXD Other adverse food reactions, not elsewhere classified, subsequent encounter: Secondary | ICD-10-CM | POA: Diagnosis not present

## 2020-03-20 DIAGNOSIS — J3089 Other allergic rhinitis: Secondary | ICD-10-CM

## 2020-03-20 DIAGNOSIS — K9049 Malabsorption due to intolerance, not elsewhere classified: Secondary | ICD-10-CM

## 2020-03-20 NOTE — Progress Notes (Signed)
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 Food intolerance - Testing was only slightly reactive to pistachio, otherwise negative. - We are going to get a lettuce IgE. - We are also going to get a serum tryptase to rule out mast cell disease.   2. Seasonal and perennial allergic rhinitis - Testing today showed: grasses, ragweed, weeds, trees, dust mites and cat - Copy of test results provided.  - Avoidance measures provided. - Stop taking: Claritin (loratadine) - Start taking: Zyrtec (cetirizine) 10mg  tablet once daily and Flonase (fluticasone) one spray per nostril daily - You can use an extra dose of the antihistamine, if needed, for breakthrough symptoms.  - Consider nasal saline rinses 1-2 times daily to remove allergens from the nasal cavities as well as help with mucous clearance (this is especially helpful to do before the nasal sprays are given) - Consider allergy shots as a means of long-term control. - Allergy shots "re-train" and "reset" the immune system to ignore environmental allergens and decrease the resulting immune response to those allergens (sneezing, itchy watery eyes, runny nose, nasal congestion, etc).    - Allergy shots improve symptoms in 75-85% of patients.  - We can discuss more at the next appointment if the medications are not working for you. - Allergy shots CAN help with oral allergy syndrome.   3. 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  - It typically occurs in people who have pollen allergies when the immune  system "sees" proteins on the food that look like proteins on the pollen.  - This results in the allergy antibody (IgE) binding to the food instead of the pollen.  - Patients typically report itching and/or mild swelling of the mouth and throat immediately following ingestion of certain uncooked fruits (including nuts) or raw vegetables.  - Only a very small number of affected individuals experience systemic allergic reactions, such as anaphylaxis which occurs with true food allergies.   - This might explain your reactions to foods.  - Chart with cross reactivities provided today.  - We are going to refer you to see GI for an evaluation of IBS.   4. Concern for COVID19 vaccine reaction - We have vaccine clinics on October 22nd and October 25th. - You can make an appointment on your way out.  5. Return in about 4 weeks (around 04/17/2020).    Subjective:   Candice Shannon is a 54 y.o. female presenting today for evaluation of  Chief Complaint  Patient presents with  . Allergic Reaction    hives, itching. She says gluten bothers her and has not eaten them. Peppers, relish, eggs, citrus fruit, soy,     Hilary Milks has a history of the following: Patient Active Problem List   Diagnosis Date Noted  . Fibromyalgia 04/10/2014  . 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 Degenerative disc disease, lumbar 02/06/2014  . 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 Arthritis of both hips 02/06/2014  . 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 GERD (gastroesophageal reflux disease) 02/06/2014  . Chronic constipation 02/06/2014  . Multiple allergies 02/06/2014  History obtained from: chart review and patient.  Candice Shannon was referred by Fleet ContrasAvbuere, Edwin, MD.     Candice BradfordKimberly is a 54 y.o. female presenting for an evaluation of multiple food reactions.  She has a variety of different foods she reacts to.  Citrus leads to sores in her mouth.  Soy leads to stomach pain.  Eggs  lead to stomach pain.  Gluten leads to stomach pain as well.  She actually took gluten out of her diet in April and some of her stomach pain improved.  Occasionally, she will get hives and hoarseness.  Cantaloupe results in "fur on [her] tongue".  She develops diarrhea with lettuce.  She has sneezing with peanut butter.  She seems to be okay with tree nuts.  She also tolerates seafood.  She does not eat beef or pork because they make her feel "sluggish".  She has tried changing her diet, but this does not seem to be helping.  She has tried intermittent fasting, 4 to 5 days/week, which is providing some relief.  All of these reactions occur within 20 minutes.  She has seen a registered dietitian, but did not get much out of it.  She has also seen gastroenterology.  She was told that she was completely normal and fine.  They did not do any labs.  This was sometime in her late teens.  Clearly, these symptoms seem to have been longstanding.   She does have a history of allergic rhinitis.  She has never been on Xyzal, Zyrtec, or Allegra.  She does use loratadine daily.  Benadryl makes her sleepy.  She reports some eye itching as well as nasal congestion.  She thinks she was tested in 2015, but she does not remember the results.  She also reports that she was recently diagnosed with fibromyalgia, which is managed by her primary care provider as well as rheumatology.  Otherwise, there is no history of other atopic diseases, including asthma, drug allergies, stinging insect allergies, eczema, urticaria or contact dermatitis. There is no significant infectious history. Vaccinations are up to date.    Past Medical History: Patient Active Problem List   Diagnosis Date Noted  . Fibromyalgia 04/10/2014  . 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 Degenerative disc disease, lumbar 02/06/2014  . 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 Arthritis of both hips 02/06/2014  . Arthritis of knee, left  02/06/2014  . GERD (gastroesophageal reflux disease) 02/06/2014  . Chronic constipation 02/06/2014  . Multiple allergies 02/06/2014    Medication List:  Allergies as of 03/20/2020      Reactions   Aspirin Nausea Only      Medication List       Accurate as of March 20, 2020 11:59 PM. If you have any questions, ask your nurse or doctor.        atenolol 25 MG tablet Commonly known as: TENORMIN   baclofen 10 MG tablet Commonly known as: LIORESAL   butalbital-acetaminophen-caffeine 50-325-40 MG tablet Commonly known as: FIORICET TAKE 1 TO 2 TABLETS BY MOUTH EVERY 6 HOURS AS NEEDED   butalbital-acetaminophen-caffeine 50-325-40 MG tablet Commonly known as: FIORICET   cetirizine 10 MG tablet Commonly known as: ZYRTEC Take 1 tablet (10 mg total) by mouth daily. Started by: Alfonse SpruceJoel Louis Roopa Graver, MD   clotrimazole-betamethasone cream Commonly known as: Lotrisone Apply 1 application topically 2 (two) times daily.   diclofenac 75 MG EC tablet Commonly known as: VOLTAREN take 1 tablet by mouth twice a day  fluticasone 50 MCG/ACT nasal spray Commonly known as: FLONASE Place 1 spray into both nostrils daily. Started by: Alfonse Spruce, MD   methocarbamol 750 MG tablet Commonly known as: ROBAXIN Take by mouth.   phentermine 37.5 MG tablet Commonly known as: Adipex-P Take 1 tablet (37.5 mg total) by mouth daily before breakfast.   Voltaren 1 % Gel Generic drug: diclofenac Sodium APPLY 2 GRAMS TO AFFECTED AREA 4 TIMES A DAY       Birth History: non-contributory  Developmental History: non-contributory  Past Surgical History: Past Surgical History:  Procedure Laterality Date  . ABDOMINAL HYSTERECTOMY  2004   ovaries are not taken  . BREAST REDUCTION SURGERY Bilateral 1998  . BUNIONECTOMY Bilateral 1983, 1993  . bunions Bilateral 1983 and 1993   both feet removed twice  . COSMETIC SURGERY Bilateral 1998   breast reduction  . DE QUERVAIN'S RELEASE   09/2019  . REDUCTION MAMMAPLASTY    . TRIGGER FINGER RELEASE    . TUBAL LIGATION  06/08/1987  . WISDOM TOOTH EXTRACTION       Family History: Family History  Problem Relation Age of Onset  . Arthritis Mother   . Miscarriages / [place] Mother   . Diabetes Father   . Heart disease Father   . Hyperlipidemia Father   . COPD Maternal Grandmother   . Emphysema Maternal Grandmother   . Diabetes Paternal Grandmother   . Heart disease Paternal Grandmother   . Hyperlipidemia Paternal Grandmother   . Drug abuse Brother   . Diabetes Brother   . Breast cancer Neg Hx      Social History: Notnamed lives at home with her family.  She lives in a house that is 56 years old.  There is hardwood throughout the home.  She has electric heating and central cooling.  There are no animals inside or outside of the home.  There are dust mite covers on the bed, but not the pillows.  There is no tobacco exposure.  She is currently on disability.  She does not use a HEPA filter.  She is not exposed to fumes, chemicals, or dust.  She does not live near an interstate or industrial area.   Review of Systems  Constitutional: Negative.  Negative for chills, fever, malaise/fatigue and weight loss.  HENT: Positive for congestion and sinus pain. Negative for ear discharge, ear pain and sore throat.   Eyes: Negative for pain, discharge and redness.  Respiratory: Negative for cough, sputum production, shortness of breath and wheezing.   Cardiovascular: Negative.  Negative for chest pain and palpitations.  Gastrointestinal: Positive for abdominal pain, diarrhea and nausea. Negative for blood in stool, constipation, heartburn and vomiting.  Skin: Negative.  Negative for itching and rash.  Neurological: Negative for dizziness and headaches.  Endo/Heme/Allergies: Positive for environmental allergies. Does not bruise/bleed easily.       Objective:   Blood pressure 120/80, pulse (!) 50, temperature (!) 97.3 F (36.3  C), resp. rate 14, height 5\' 6"  (1.676 m), weight 191 lb 9.6 oz (86.9 kg), SpO2 98 %. Body mass index is 30.93 kg/m.   Physical Exam:   Physical Exam Constitutional:      Appearance: She is well-developed.  HENT:     Head: Normocephalic and atraumatic.     Right Ear: Tympanic membrane, ear canal and external ear normal. No drainage, swelling or tenderness. Tympanic membrane is not injected, scarred, erythematous, retracted or bulging.     Left Ear: Tympanic membrane, ear canal  and external ear normal. No drainage, swelling or tenderness. Tympanic membrane is not injected, scarred, erythematous, retracted or bulging.     Nose: No nasal deformity, septal deviation, mucosal edema or rhinorrhea.     Right Turbinates: Enlarged and swollen.     Left Turbinates: Enlarged and swollen.     Right Sinus: No maxillary sinus tenderness or frontal sinus tenderness.     Left Sinus: No maxillary sinus tenderness or frontal sinus tenderness.     Mouth/Throat:     Mouth: Mucous membranes are not pale and not dry.     Pharynx: Uvula midline.  Eyes:     General:        Right eye: No discharge.        Left eye: No discharge.     Conjunctiva/sclera: Conjunctivae normal.     Right eye: Right conjunctiva is not injected. No chemosis.    Left eye: Left conjunctiva is not injected. No chemosis.    Pupils: Pupils are equal, round, and reactive to light.  Cardiovascular:     Rate and Rhythm: Normal rate and regular rhythm.     Heart sounds: Normal heart sounds.  Pulmonary:     Effort: Pulmonary effort is normal. No tachypnea, accessory muscle usage or respiratory distress.     Breath sounds: Normal breath sounds. No wheezing, rhonchi or rales.  Chest:     Chest wall: No tenderness.  Abdominal:     Tenderness: There is no abdominal tenderness. There is no guarding or rebound.  Lymphadenopathy:     Head:     Right side of head: No submandibular, tonsillar or occipital adenopathy.     Left side of  head: No submandibular, tonsillar or occipital adenopathy.     Cervical: No cervical adenopathy.  Skin:    General: Skin is warm.     Capillary Refill: Capillary refill takes less than 2 seconds.     Coloration: Skin is not pale.     Findings: No abrasion, erythema, petechiae or rash. Rash is not papular, urticarial or vesicular.     Comments: No eczematous or urticarial lesions noted.  Neurological:     Mental Status: She is alert.      Diagnostic studies:   Allergy Studies:   Environmental allergy prick testing: Positive to grasses, weeds, ragweed, and dust mites  Environmental allergy intradermal testing: Positive to Johnson grass as well as cat.  Full food panel: Slightly reactive to pistachio, otherwise negative to the remainder of the panel       Malachi Bonds, MD [facility]

## 2020-03-20 NOTE — Telephone Encounter (Signed)
Patient needs referral to GI for an evaluation of IBS. Thank you.

## 2020-03-20 NOTE — Patient Instructions (Addendum)
1. Food intolerance - Testing was only slightly reactive to pistachio, otherwise negative. - We are going to get a lettuce IgE. - We are also going to get a serum tryptase to rule out mast cell disease.   2. Seasonal and perennial allergic rhinitis - Testing today showed: grasses, ragweed, weeds, trees, dust mites and cat - Copy of test results provided.  - Avoidance measures provided. - Stop taking: Claritin (loratadine) - Start taking: Zyrtec (cetirizine) 10mg  tablet once daily and Flonase (fluticasone) one spray per nostril daily - You can use an extra dose of the antihistamine, if needed, for breakthrough symptoms.  - Consider nasal saline rinses 1-2 times daily to remove allergens from the nasal cavities as well as help with mucous clearance (this is especially helpful to do before the nasal sprays are given) - Consider allergy shots as a means of long-term control. - Allergy shots "re-train" and "reset" the immune system to ignore environmental allergens and decrease the resulting immune response to those allergens (sneezing, itchy watery eyes, runny nose, nasal congestion, etc).    - Allergy shots improve symptoms in 75-85% of patients.  - We can discuss more at the next appointment if the medications are not working for you. - Allergy shots CAN help with oral allergy syndrome.   3. 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  - It typically occurs in people who have pollen allergies when the immune system "sees" proteins on the food that look like proteins on the pollen.  - This results in the allergy antibody (IgE) binding to the food instead of the pollen.  - Patients typically report itching and/or mild swelling of the mouth and throat immediately following ingestion of certain uncooked fruits (including nuts) or raw vegetables.    - Only a very small number of affected individuals experience systemic allergic reactions, such as anaphylaxis which occurs with true food allergies.   - This might explain your reactions to foods.  - Chart with cross reactivities provided today.  - We are going to refer you to see GI for an evaluation of IBS.   4. Concern for COVID19 vaccine reaction - We have vaccine clinics on October 22nd and October 25th. - You can make an appointment on your way out.  5. Return in about 4 weeks (around 04/17/2020).    Please inform 13/04/2020 of any Emergency Department visits, hospitalizations, or changes in symptoms. Call us before going to the ED for breathing or allergy symptoms since we might be able to fit you in for a sick visit. Feel free to contact us anytime with any questions, problems, or concerns.  It was a pleasure to meet you today!  Websites that have reliable patient information: 1. American Academy of Asthma, Allergy, and Immunology: www.aaaai.org 2. Food Allergy Research and Education (FARE): foodallergy.org 3. Mothers of Asthmatics: http://www.asthmacommunitynetwork.org 4. American College of Allergy, Asthma, and Immunology: www.acaai.org   COVID-19 Vaccine Information can be found at: [place] For questions related to vaccine distribution or appointments, please email [email or web address] or call [phone].     "Like" [phone] on Facebook and Instagram for our latest updates!     HAPPY FALL!     Make sure you are registered to vote! If you have moved or changed any of your contact information, you will need to get this updated before voting!  In some cases, you  MAY be able to register to vote online: [email or web address]     Oral Allergy Syndrome Cross Reactivity Chart     Reducing Pollen Exposure  The American Academy of Allergy, Asthma and Immunology suggests the  following steps to reduce your exposure to pollen during allergy seasons.    1. Do not hang sheets or clothing out to dry; pollen may collect on these items. 2. Do not mow lawns or spend time around freshly cut grass; mowing stirs up pollen. 3. Keep windows closed at night.  Keep car windows closed while driving. 4. Minimize morning activities outdoors, a time when pollen counts are usually at their highest. 5. Stay indoors as much as possible when pollen counts or humidity is high and on windy days when pollen tends to remain in the air longer. 6. Use air conditioning when possible.  Many air conditioners have filters that trap the pollen spores. 7. Use a HEPA room air filter to remove pollen form the indoor air you breathe.  Control of Dust Mite Allergen    Dust mites play a major role in allergic asthma and rhinitis.  They occur in environments with high humidity wherever human skin is found.  Dust mites absorb humidity from the atmosphere (ie, they do not drink) and feed on organic matter (including shed human and animal skin).  Dust mites are a microscopic type of insect that you cannot see with the naked eye.  High levels of dust mites have been detected from mattresses, pillows, carpets, upholstered furniture, bed covers, clothes, soft toys and any woven material.  The principal allergen of the dust mite is found in its feces.  A gram of dust may contain 1,000 mites and 250,000 fecal particles.  Mite antigen is easily measured in the air during house cleaning activities.  Dust mites do not bite and do not cause harm to humans, other than by triggering allergies/asthma.    Ways to decrease your exposure to dust mites in your home:  1. Encase mattresses, box springs and pillows with a mite-impermeable barrier or cover   2. Wash sheets, blankets and drapes weekly in hot water (130 F) with detergent and dry them in a dryer on the hot setting.  3. Have the room cleaned frequently with a vacuum  cleaner and a damp dust-mop.  For carpeting or rugs, vacuuming with a vacuum cleaner equipped with a high-efficiency particulate air (HEPA) filter.  The dust mite allergic individual should not be in a room which is being cleaned and should wait 1 hour after cleaning before going into the room. 4. Do not sleep on upholstered furniture (eg, couches).   5. If possible removing carpeting, upholstered furniture and drapery from the home is ideal.  Horizontal blinds should be eliminated in the rooms where the person spends the most time (bedroom, study, television room).  Washable vinyl, roller-type shades are optimal. 6. Remove all non-washable stuffed toys from the bedroom.  Wash stuffed toys weekly like sheets and blankets above.   7. Reduce indoor humidity to less than 50%.  Inexpensive humidity monitors can be purchased at most hardware stores.  Do not use a humidifier as can make the problem worse and are not recommended.  Control of Dog or Cat Allergen  Avoidance is the best way to manage a dog or cat allergy. If you have a dog or cat and are allergic to dog or cats, consider removing the dog or cat from the home. If you have a dog or  cat but don't want to find it a new home, or if your family wants a pet even though someone in the household is allergic, here are some strategies that may help keep symptoms at bay:  1. Keep the pet out of your bedroom and restrict it to only a few rooms. Be advised that keeping the dog or cat in only one room will not limit the allergens to that room. 2. Don't pet, hug or kiss the dog or cat; if you do, wash your hands with soap and water. 3. High-efficiency particulate air (HEPA) cleaners run continuously in a bedroom or living room can reduce allergen levels over time. 4. Regular use of a high-efficiency vacuum cleaner or a central vacuum can reduce allergen levels. 5. Giving your dog or cat a bath at least once a week can reduce airborne allergen.  Allergy Shots    Allergies are the result of a chain reaction that starts in the immune system. Your immune system controls how your body defends itself. For instance, if you have an allergy to pollen, your immune system identifies pollen as an invader or allergen. Your immune system overreacts by producing antibodies called Immunoglobulin E (IgE). These antibodies travel to cells that release chemicals, causing an allergic reaction.  The concept behind allergy immunotherapy, whether it is received in the form of shots or tablets, is that the immune system can be desensitized to specific allergens that trigger allergy symptoms. Although it requires time and patience, the payback can be long-term relief.  How Do Allergy Shots Work?  Allergy shots work much like a vaccine. Your body responds to injected amounts of a particular allergen given in increasing doses, eventually developing a resistance and tolerance to it. Allergy shots can lead to decreased, minimal or no allergy symptoms.  There generally are two phases: build-up and maintenance. Build-up often ranges from three to six months and involves receiving injections with increasing amounts of the allergens. The shots are typically given once or twice a week, though more rapid build-up schedules are sometimes used.  The maintenance phase begins when the most effective dose is reached. This dose is different for each person, depending on how allergic you are and your response to the build-up injections. Once the maintenance dose is reached, there are longer periods between injections, typically two to four weeks.  Occasionally doctors give cortisone-type shots that can temporarily reduce allergy symptoms. These types of shots are different and should not be confused with allergy immunotherapy shots.  Who Can Be Treated with Allergy Shots?  Allergy shots may be a good treatment approach for people with allergic rhinitis (hay fever), allergic asthma,  conjunctivitis (eye allergy) or stinging insect allergy.   Before deciding to begin allergy shots, you should consider:  . The length of allergy season and the severity of your symptoms . Whether medications and/or changes to your environment can control your symptoms . Your desire to avoid long-term medication use . Time: allergy immunotherapy requires a major time commitment . Cost: may vary depending on your insurance coverage  Allergy shots for children age 72 and older are effective and often well tolerated. They might prevent the onset of new allergen sensitivities or the progression to asthma.  Allergy shots are not started on patients who are pregnant but can be continued on patients who become pregnant while receiving them. In some patients with other medical conditions or who take certain common medications, allergy shots may be of risk. It is important to  mention other medications you talk to your allergist.   When Will I Feel Better?  Some may experience decreased allergy symptoms during the build-up phase. For others, it may take as long as 12 months on the maintenance dose. If there is no improvement after a year of maintenance, your allergist will discuss other treatment options with you.  If you aren't responding to allergy shots, it may be because there is not enough dose of the allergen in your vaccine or there are missing allergens that were not identified during your allergy testing. Other reasons could be that there are high levels of the allergen in your environment or major exposure to non-allergic triggers like tobacco smoke.  What Is the Length of Treatment?  Once the maintenance dose is reached, allergy shots are generally continued for three to five years. The decision to stop should be discussed with your allergist at that time. Some people may experience a permanent reduction of allergy symptoms. Others may relapse and a longer course of allergy shots can be  considered.  What Are the Possible Reactions?  The two types of adverse reactions that can occur with allergy shots are local and systemic. Common local reactions include very mild redness and swelling at the injection site, which can happen immediately or several hours after. A systemic reaction, which is less common, affects the entire body or a particular body system. They are usually mild and typically respond quickly to medications. Signs include increased allergy symptoms such as sneezing, a stuffy nose or hives.  Rarely, a serious systemic reaction called anaphylaxis can develop. Symptoms include swelling in the throat, wheezing, a feeling of tightness in the chest, nausea or dizziness. Most serious systemic reactions develop within 30 minutes of allergy shots. This is why it is strongly recommended you wait in your doctor's office for 30 minutes after your injections. Your allergist is trained to watch for reactions, and his or her staff is trained and equipped with the proper medications to identify and treat them.  Who Should Administer Allergy Shots?  The preferred location for receiving shots is your prescribing allergist's office. Injections can sometimes be given at another facility where the physician and staff are trained to recognize and treat reactions, and have received instructions by your prescribing allergist.

## 2020-03-23 ENCOUNTER — Encounter: Payer: Self-pay | Admitting: Allergy & Immunology

## 2020-03-23 MED ORDER — FLUTICASONE PROPIONATE 50 MCG/ACT NA SUSP: 1.0000 | Freq: Every day | NASAL | 5 refills | Status: AC

## 2020-03-23 MED ORDER — CETIRIZINE HCL 10 MG PO TABS: 10.0000 mg | ORAL_TABLET | Freq: Every day | ORAL | 5 refills | Status: AC

## 2020-03-24 LAB — ALLERGEN, LETTUCE, F215: Allergen Lettuce IgE: <0.1 kU/L

## 2020-03-24 LAB — TRYPTASE: Tryptase: 4.6 ug/L (ref 2.2–13.2)

## 2020-03-24 NOTE — Telephone Encounter (Signed)
Referral placed to [facility] for scheduling. Patient has been informed of this information.   Thanks   [facility]  [address] [place], [place]  [place]: [phone]

## 2020-03-27 ENCOUNTER — Encounter: Payer: Self-pay | Admitting: Nurse Practitioner

## 2020-03-28 ENCOUNTER — Ambulatory Visit (INDEPENDENT_AMBULATORY_CARE_PROVIDER_SITE_OTHER): Payer: Medicare HMO | Admitting: *Deleted

## 2020-03-28 ENCOUNTER — Other Ambulatory Visit: Payer: Self-pay

## 2020-03-28 DIAGNOSIS — Z23 Encounter for immunization: Secondary | ICD-10-CM | POA: Diagnosis not present

## 2020-03-28 NOTE — Progress Notes (Addendum)
   Covid-19 Vaccination Clinic  Name:  Candice Shannon    MRN: [id number] DOB: [birth year]  03/28/2020  [name] was observed post Covid-19 immunization for 30 minutes based on pre-vaccination screening without incident. Patient experienced some dizziness but was fine to leave after her 30 minute assessment. She was provided with Vaccine Information Sheet and instruction to access the V-Safe system.   [name] was instructed to call 911 with any severe reactions post vaccine: Marland Kitchen Difficulty breathing  . Swelling of face and throat  . A fast heartbeat  . A bad rash all over body  . Dizziness and weakness   Immunizations Administered    Name Date Dose VIS Date Route   Pfizer COVID-19 Vaccine 03/28/2020  9:00 AM 0.3 mL 08/01/2018 Intramuscular   Manufacturer: [employer], [employer]   Lot: [id number]   NDC: [address]-8

## 2020-04-14 ENCOUNTER — Ambulatory Visit: Payer: Medicare HMO | Admitting: Nurse Practitioner

## 2020-04-14 ENCOUNTER — Encounter: Payer: Self-pay | Admitting: Nurse Practitioner

## 2020-04-14 DIAGNOSIS — K529 Noninfective gastroenteritis and colitis, unspecified: Secondary | ICD-10-CM

## 2020-04-14 NOTE — Patient Instructions (Addendum)
You have been given a testing kit to check for small intestine bacterial overgrowth (SIBO) which is completed by a company named [facility]. Make sure to return your test in the mail using the return mailing label given to you along with the kit. Your demographic and insurance information have already been sent to the company and they should be in contact with you over the next week regarding this test. [facility] will collect an upfront charge of $99.74 for commercial insurance plans and $209.74 is you are paying cash. Make sure to discuss with [facility] PRIOR to having the test if they have gotten informatoin from your insurance company as to how much your testing will cost out of pocket, if any. Please keep in mind that you will be getting a call from phone number [phone] or a similar number. If you do not hear from them within this time frame, please call our office at [phone].   Please take over the counter Benefiber, one tablespoon daily.  Please take over the counter Lactaid one to two tablets with each daily poduct.  Please contact your neurologist regarding Atenolol dosing with a low heart rate of 56.  Please come back and see Dr Fuller Plan 06/09/2020 at 10:50AM.  I appreciate the opportunity to care for you. Candice Shannon, CRNP

## 2020-04-14 NOTE — Progress Notes (Signed)
Reviewed and agree with management plan.  Aamina Skiff T. Ammie Warrick, MD FACG [facility]  

## 2020-04-14 NOTE — Progress Notes (Addendum)
04/14/2020 Candice Shannon [id number] [birth year]   CHIEF COMPLAINT: chronic diarrhea   HISTORY OF PRESENT ILLNESS:  Candice Shannon is a 54 year old female with a past medical history of arthritis, fibromyalgia, anxiety, migraine headaches and a colon polyp.  Past partial hysterectomy 2004, right thumb trigger finger surgery 09/2019, right knee arthroscopy 2017, breast reduction 1988, tubal ligation 1989, D & C, wisdom teeth extraction 1983 and bunionectomy in 1993 and 1983.  She presents to our office as referred by her allergist Dr. Dellis Anes for further evaluation regarding chronic diarrhea.  She reports having diarrhea with intermittent constipation for the past 30 years.  Her diarrhea has not worsened.  She describes passing 2 bowel movements daily, one BM is solid and one BM is loose and the order varies.  No rectal bleeding or melena.  No greasy or floating stools.  No weight loss.  She previously had stomach pain which resolved after she went gluten-free in April 2021, however her diarrhea did not improve.  She reports having celiac blood testing in the past which was negative.  She was previously followed by Dr. Elnoria Howard.  She underwent an EGD by Dr. Elnoria Howard 01/16/2015 which was normal.  Duodenal biopsies were not obtained.  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  No evidence of inflammatory bowel disease/colitis.  I will request a copy of her colonoscopy biopsy results further review.  No family history of IBD or colon cancer.  She was concerned she had food allergies which trigger her GI symptoms.  She was evaluated by allergist Dr. Dellis Anes 03/20/2020 for possible pollen-food allergy syndrome.  Food intolerance testing was slightly reactive to pistachio otherwise was negative.  Lettuce IgE was negative.  Serum tryptase to rule out for mast cell disease was  negative.  Her mental allergen testing were positive for grasses, ragweed, weeds, trees, dust mites and cats.  She was prescribed Zyrtec and Flonase.  She reports fasting for 24 hours once weekly and sometimes a 16-hour fast 4-5 times weekly to facilitate her digestion over the past 3 weeks.  She has intentionally lost 19 pounds over the past year.  Previously took several different probiotics which resulted in stomach pain.  She eats ice cream a few days weekly and adds coffee creamer a few days weekly.  She drinks 1 cup of coffee daily.  No artificial sweeteners.  She reports her stress level is much lower at this time.  No recent antibiotics.  Her newest medication is Atenolol 100 mg daily prescribed by her neurologist for migraine headaches.  He reported having prolonged sedation, difficulty waking up with one of her past surgeries.  Problems with propofol use for her EGD and colonoscopy.   Past Medical History:  Diagnosis Date  . Allergy 1970  . Anxiety 1975  . Arthritis 1977  . Degenerative disc disease, lumbar 02/06/2014  . Fibromyalgia 2003  . Ulcer 06/08/1983   Gastric Ulcer   Past Surgical History:  Procedure Laterality Date  . ABDOMINAL HYSTERECTOMY  2004   ovaries are not taken  . BREAST REDUCTION SURGERY Bilateral 1998  . BUNIONECTOMY Bilateral 1983, 1993  . bunions Bilateral 1983 and 1993   both feet removed twice  . COSMETIC SURGERY Bilateral 1998   breast reduction  . DE QUERVAIN'S RELEASE  09/2019  . REDUCTION MAMMAPLASTY    . TRIGGER FINGER RELEASE    .  TUBAL LIGATION  06/08/1987  . WISDOM TOOTH EXTRACTION     Social History: She is divorced. Daughter 34 cervical cancer and bipolar disorder. She smoked 1 pack of cigarettes weekly from the ages of 52 to 62. She drinks 4 glasses wine yearly. Remote drug use in high school/college.   Family History: Father died 13 heart disease. Mother age 43 rotator cuff surgery, hypotension and upper respiratory issues. Maternal  grandmother with COPD. Paternal grandmother with DM.  No family history of esophageal, gastric or colon cancer.  Allergies  Allergen Reactions  . Aspirin Nausea Only      Outpatient Encounter Medications as of 04/14/2020  Medication Sig  . atenolol (TENORMIN) 25 MG tablet   . baclofen (LIORESAL) 10 MG tablet   . butalbital-acetaminophen-caffeine (FIORICET) 50-325-40 MG tablet  (Patient not taking: Reported on 03/20/2020)  . butalbital-acetaminophen-caffeine (FIORICET) 50-325-40 MG tablet TAKE 1 TO 2 TABLETS BY MOUTH EVERY 6 HOURS AS NEEDED (Patient not taking: Reported on 03/20/2020)  . cetirizine (ZYRTEC) 10 MG tablet Take 1 tablet (10 mg total) by mouth daily.  . clotrimazole-betamethasone (LOTRISONE) cream Apply 1 application topically 2 (two) times daily. (Patient not taking: Reported on 03/20/2020)  . diclofenac (VOLTAREN) 75 MG EC tablet take 1 tablet by mouth twice a day (Patient not taking: Reported on 03/20/2020)  . fluticasone (FLONASE) 50 MCG/ACT nasal spray Place 1 spray into both nostrils daily.  . methocarbamol (ROBAXIN) 750 MG tablet Take by mouth. (Patient not taking: Reported on 03/20/2020)  . phentermine (ADIPEX-P) 37.5 MG tablet Take 1 tablet (37.5 mg total) by mouth daily before breakfast. (Patient not taking: Reported on 03/20/2020)  . VOLTAREN 1 % GEL APPLY 2 GRAMS TO AFFECTED AREA 4 TIMES A DAY   No facility-administered encounter medications on file as of 04/14/2020.     REVIEW OF SYSTEMS:   Gen: See HPI.  CV: Denies chest pain, palpitations or edema. Resp: Denies cough, shortness of breath of hemoptysis.  GI: See HPI.  GU : Denies urinary burning, blood in urine, increased urinary frequency or incontinence. MS: + back pain.  Derm: + rash and itchiness. No  lesions or unhealing ulcers. Psych: Past anxiety. Denies depression.  Heme: Denies bruising, bleeding. Neuro:  + headaches.  Endo:  Denies any problems with DM, thyroid or adrenal  function.    PHYSICAL EXAM: BP 114/70 (BP Location: Right Arm, Patient Position: Sitting, Cuff Size: Normal)   Pulse (!) 56   Ht 5\' 6"  (1.676 m)   Wt 188 lb 2 oz (85.3 kg)   BMI 30.36 kg/m   General: Well developed 54 year old female in no acute distress. Head: Normocephalic and atraumatic. Eyes:  Sclerae non-icteric, conjunctive pink. Ears: Normal auditory acuity. Mouth: Dentition intact. No ulcers or lesions.  Neck: Supple, no lymphadenopathy or thyromegaly.  Lungs: Clear bilaterally to auscultation without wheezes, crackles or rhonchi. Heart: Regular rate and rhythm. No murmur, rub or gallop appreciated.  Abdomen: Soft, nontender, non distended. No masses. No hepatosplenomegaly. Normoactive bowel sounds x 4 quadrants.  Rectal: Deferred. Musculoskeletal: Symmetrical with no gross deformities. Skin: Warm and dry. No rash or lesions on visible extremities. Extremities: No edema. Neurological: Alert oriented x 4, no focal deficits.  Psychological:  Alert and cooperative. Normal mood and affect.  ASSESSMENT AND PLAN:  67.  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  History of a 39mm polyp to the sigmoid colon per colonoscopy 01/24/2014 -Request copy of colonoscopy biopsy report to verify colonoscopy recall date  3.  Bradycardia with heart rate 56, asymptomatic.  Patient was recently started on atenolol 1 her milligrams daily by her neurologist for migraine headaches. -Patient to follow-up with her neurologist regarding heart rate 56, Atenolol management  ADDENDUM: EGD and colonoscopy records received from Dr. Haywood Pao office. EGD 01/16/2015 was normal. Colonoscopy 01/24/2014 identified  a 32mm hyperplastic polyp removed from the sigmoid colon and a single medium diverticulum in the sigmoid colon. 10 Year colonoscopy recall, earlier if symptoms warrant.   CC:  Fleet Contras, MD

## 2020-04-17 ENCOUNTER — Other Ambulatory Visit: Payer: Self-pay

## 2020-04-17 ENCOUNTER — Encounter: Payer: Self-pay | Admitting: Allergy & Immunology

## 2020-04-17 ENCOUNTER — Ambulatory Visit: Payer: Medicare HMO | Admitting: Allergy & Immunology

## 2020-04-17 VITALS — BP 110/72 | HR 102 | Resp 16

## 2020-04-17 DIAGNOSIS — K9049 Malabsorption due to intolerance, not elsewhere classified: Secondary | ICD-10-CM | POA: Diagnosis not present

## 2020-04-17 DIAGNOSIS — J3089 Other allergic rhinitis: Secondary | ICD-10-CM | POA: Diagnosis not present

## 2020-04-17 DIAGNOSIS — J302 Other seasonal allergic rhinitis: Secondary | ICD-10-CM

## 2020-04-17 DIAGNOSIS — T781XXD Other adverse food reactions, not elsewhere classified, subsequent encounter: Secondary | ICD-10-CM | POA: Diagnosis not present

## 2020-04-17 DIAGNOSIS — R109 Unspecified abdominal pain: Secondary | ICD-10-CM | POA: Diagnosis not present

## 2020-04-17 NOTE — Progress Notes (Signed)
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 Food intolerance - Continue to avoid your triggering foods. - I look forward to seeing the results of the breath test.   2. Seasonal and perennial allergic rhinitis (grasses, ragweed, weeds, trees, dust mites and cat) - Continue taking: Zyrtec (cetirizine) 10mg  tablet once daily and Flonase (fluticasone) one spray per nostril daily - You can use an extra dose of the antihistamine, if needed, for breakthrough symptoms.  - Consider nasal saline rinses 1-2 times daily to remove allergens from the nasal cavities as well as help with mucous clearance (this is especially helpful to do before the nasal sprays are given) - Consider allergy shots as a means of long-term control.  3. Return in about 6 months (around 10/15/2020).    Subjective:   Candice Shannon is a 54 y.o. female presenting today for follow up of  Chief Complaint  Patient presents with  . Allergic Rhinitis     doing well with no issues. no other problems to discuss.     Candice Shannon has a history of the following: Patient Active Problem List   Diagnosis Date Noted  . Chronic diarrhea 04/14/2020  . Fibromyalgia 04/10/2014  . 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 Degenerative disc disease, lumbar 02/06/2014  . 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 Arthritis of both hips 02/06/2014  . 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 GERD (gastroesophageal reflux disease) 02/06/2014  . Chronic constipation 02/06/2014  . Multiple allergies 02/06/2014    History obtained from: chart review and patient.  Candice Shannon is a 54 y.o. female  presenting for a follow up visit.  She was last seen in October 2021.  At that time, we did testing that showed positives to grasses, ragweed, weeds, trees, dust mite, and cats.  We stopped her Claritin and started Zyrtec as well as Flonase.  We did discuss oral allergy syndrome and the fact that allergen immunotherapy can help with this.  We did for testing that was only slightly reactive to pistachio.  We did get a lettuce IgE as well since she was reporting symptoms with that.  This was negative.  Serum tryptase was normal.  Since last visit, she has mostly done well. She did go to see GI who told her to take fiber as well as Lactaid. Her stools are not runny now, so that itself is an improvement. She continues to avoid a lot of foods. But she does not have to worry about using epinephrine at all. Once it starts bothering her, she will no longer eat it. She will try to introduce it back into her diet in a few months and then leave off. GI is wanting to do a Heliobacter pylori breath test. Then they will go back and see how things are going. There was a concern on her low heart rate, which is secondary to the atenolol. She is the middle of adjusting that dosage. She has seen a registered dietician in the past, but this was not very fruitful.   Allergic Rhinitis Symptom History: Environmental symptoms are good with the use of the fluticasone and the cetirizine. This combination seems to be working well. She is not going to do any allergy  shots at this point because she wants to focus on her GI issues first. She is using her medications on a routine basis. She is thinking that since she is allergic to [place], she is going to move to [place]. She needs to get her mother on board with this plan, however.   Otherwise, there have been no changes to her past medical history, surgical history, family history, or social history.    Review of Systems  Constitutional: Negative.  Negative for chills, fever,  malaise/fatigue and weight loss.  HENT: Positive for congestion. Negative for ear discharge, ear pain and sinus pain.   Eyes: Negative for pain, discharge and redness.  Respiratory: Negative for cough, sputum production, shortness of breath and wheezing.   Cardiovascular: Negative.  Negative for chest pain and palpitations.  Gastrointestinal: Positive for abdominal pain, diarrhea and nausea. Negative for constipation, heartburn and vomiting.  Skin: Negative.  Negative for itching and rash.  Neurological: Negative for dizziness and headaches.  Endo/Heme/Allergies: Positive for environmental allergies. Does not bruise/bleed easily.       Objective:   Blood pressure 110/72, pulse (!) 102, resp. rate 16. There is no height or weight on file to calculate BMI.   Physical Exam:  Physical Exam Constitutional:      Appearance: She is well-developed.     Comments: Pleasant female. Cooperative with the exam.   HENT:     Head: Normocephalic and atraumatic.     Right Ear: Tympanic membrane, ear canal and external ear normal.     Left Ear: Tympanic membrane, ear canal and external ear normal.     Nose: No nasal deformity, septal deviation, mucosal edema or rhinorrhea.     Right Turbinates: Enlarged and swollen.     Left Turbinates: Enlarged and swollen.     Right Sinus: No maxillary sinus tenderness or frontal sinus tenderness.     Left Sinus: No maxillary sinus tenderness or frontal sinus tenderness.     Mouth/Throat:     Mouth: Mucous membranes are not pale and not dry.     Pharynx: Uvula midline.     Comments: Cobblestoning present in the posterior oropharynx. Tonsils unremarkable.  Eyes:     General:        Right eye: No discharge.        Left eye: No discharge.     Conjunctiva/sclera: Conjunctivae normal.     Right eye: Right conjunctiva is not injected. No chemosis.    Left eye: Left conjunctiva is not injected. No chemosis.    Pupils: Pupils are equal, round, and reactive to  light.  Cardiovascular:     Rate and Rhythm: Normal rate and regular rhythm.     Heart sounds: Normal heart sounds.  Pulmonary:     Effort: Pulmonary effort is normal. No tachypnea, accessory muscle usage or respiratory distress.     Breath sounds: Normal breath sounds. No wheezing, rhonchi or rales.  Chest:     Chest wall: No tenderness.  Lymphadenopathy:     Cervical: No cervical adenopathy.  Skin:    Coloration: Skin is not pale.     Findings: No abrasion, erythema, petechiae or rash. Rash is not papular, urticarial or vesicular.  Neurological:     Mental Status: She is alert.      Diagnostic studies: none     Malachi Bonds, MD  [facility]

## 2020-04-17 NOTE — Patient Instructions (Addendum)
1. Food intolerance - Continue to avoid your triggering foods. - I look forward to seeing the results of the breath test.   2. Seasonal and perennial allergic rhinitis (grasses, ragweed, weeds, trees, dust mites and cat) - Continue taking: Zyrtec (cetirizine) 10mg  tablet once daily and Flonase (fluticasone) one spray per nostril daily - You can use an extra dose of the antihistamine, if needed, for breakthrough symptoms.  - Consider nasal saline rinses 1-2 times daily to remove allergens from the nasal cavities as well as help with mucous clearance (this is especially helpful to do before the nasal sprays are given) - Consider allergy shots as a means of long-term control.  3. Return in about 6 months (around 10/15/2020).    Please inform 12/15/2020 of any Emergency Department visits, hospitalizations, or changes in symptoms. Call us before going to the ED for breathing or allergy symptoms since we might be able to fit you in for a sick visit. Feel free to contact us anytime with any questions, problems, or concerns.  It was a pleasure to see you again today!  Websites that have reliable patient information: 1. American Academy of Asthma, Allergy, and Immunology: www.aaaai.org 2. Food Allergy Research and Education (FARE): foodallergy.org 3. Mothers of Asthmatics: http://www.asthmacommunitynetwork.org 4. American College of Allergy, Asthma, and Immunology: www.acaai.org   COVID-19 Vaccine Information can be found at: [place] For questions related to vaccine distribution or appointments, please email [email or web address] or call [phone].     "Like" [phone] on Facebook and Instagram for our latest updates!     HAPPY FALL!     Make sure you are registered to vote! If you have moved or changed any of your contact information, you will need to get this updated before voting!  In some cases, you MAY be able to register to  vote online: [place]

## 2020-04-18 ENCOUNTER — Encounter: Payer: Self-pay | Admitting: Allergy & Immunology

## 2020-04-25 ENCOUNTER — Other Ambulatory Visit: Payer: Self-pay

## 2020-04-25 ENCOUNTER — Ambulatory Visit (INDEPENDENT_AMBULATORY_CARE_PROVIDER_SITE_OTHER): Payer: Medicare HMO

## 2020-04-25 VITALS — BP 118/84 | HR 51 | Resp 12

## 2020-04-25 DIAGNOSIS — Z23 Encounter for immunization: Secondary | ICD-10-CM

## 2020-06-02 ENCOUNTER — Other Ambulatory Visit: Payer: Self-pay

## 2020-06-02 ENCOUNTER — Telehealth: Payer: Self-pay | Admitting: Nurse Practitioner

## 2020-06-02 MED ORDER — RIFAXIMIN 550 MG PO TABS: 550.0000 mg | ORAL_TABLET | Freq: Three times a day (TID) | ORAL | 0 refills | Status: DC

## 2020-06-02 NOTE — Telephone Encounter (Signed)
Beth, pls contact the patient and let her know her SIBO breath test was positive. Pls send RX for Xifaxan 550mg  550mg  one capsule po tid x 14 days. # 42, No 0 refills. When done with Xifaxan take Florastor probiotic one po bid x 2 to 4 weeks. THX

## 2020-06-02 NOTE — Telephone Encounter (Signed)
Candice Shannon, I would have patient reschedule her follow up appointment a week or two after she finishes the Xifaxan. Thx

## 2020-06-02 NOTE — Telephone Encounter (Signed)
Called patient and gave Breath test results. Order sent to patient's pharmacy for Xifaxan and gave instructions for taking and the recommended Florastor after. Patient wants to know if she should keep her office visit with Dr. Russella Dar on 06/09/20 or if she should reschedule for after finishing these treatments. Please advise

## 2020-06-03 ENCOUNTER — Telehealth: Payer: Self-pay | Admitting: Gastroenterology

## 2020-06-03 DIAGNOSIS — K6389 Other specified diseases of intestine: Secondary | ICD-10-CM

## 2020-06-03 MED ORDER — RIFAXIMIN 550 MG PO TABS: 550.0000 mg | ORAL_TABLET | Freq: Three times a day (TID) | ORAL | 0 refills | Status: AC

## 2020-06-03 NOTE — Telephone Encounter (Signed)
RX has been sent to [facility], they will do the PA.

## 2020-06-03 NOTE — Addendum Note (Signed)
Addended by: Darliss Ridgel I on: 06/03/2020 11:45 AM   Modules accepted: Orders

## 2020-06-03 NOTE — Telephone Encounter (Signed)
Pt is requesting a pre authorization to be submitted for her Xifaxan.

## 2020-06-03 NOTE — Telephone Encounter (Signed)
Called and spoke to [facility].  Prior Berkley Harvey is required for [employer]  Xifaxan was sent to treat SIBO.  Initiated PA over the phone. Should hear back within 24 hours.

## 2020-06-09 ENCOUNTER — Ambulatory Visit: Payer: Medicare HMO | Admitting: Gastroenterology

## 2020-06-24 ENCOUNTER — Ambulatory Visit: Payer: Medicare Other | Admitting: Gastroenterology

## 2020-06-24 ENCOUNTER — Encounter: Payer: Self-pay | Admitting: Gastroenterology

## 2020-06-24 VITALS — BP 90/60 | HR 59 | Ht 66.0 in | Wt 182.0 lb

## 2020-06-24 DIAGNOSIS — K6389 Other specified diseases of intestine: Secondary | ICD-10-CM | POA: Diagnosis not present

## 2020-06-24 DIAGNOSIS — K529 Noninfective gastroenteritis and colitis, unspecified: Secondary | ICD-10-CM

## 2020-06-24 NOTE — Patient Instructions (Signed)
Thank you for choosing me and [facility].  Malcolm T. Stark, Jr., MD., FACG  

## 2020-06-24 NOTE — Progress Notes (Signed)
    History of Present Illness: This is a 55 year old female returning for follow-up of chronic diarrhea.  See April 15, 2011 office note from [place], NP. SIBO breath test was positive and she was treated with a course of Xifaxan.  She was recommended to use Lactaid with all dairy products.  She has had substantial improvement in her diarrhea.  She relates having 1-2 soft formed stools daily.  Occasionally her stools are slightly looser.  Current Medications, Allergies, Past Medical History, Past Surgical History, Family History and Social History were reviewed in [employer] record.   Physical Exam: General: Well developed, well nourished, no acute distress Head: Normocephalic and atraumatic Eyes:  sclerae anicteric, EOMI Ears: Normal auditory acuity Mouth: Not examined, mask on during Covid-19 pandemic Lungs: Clear throughout to auscultation Heart: Regular rate and rhythm; no murmurs, rubs or bruits Abdomen: Soft, non tender and non distended. No masses, hepatosplenomegaly or hernias noted. Normal Bowel sounds Rectal: Not done Musculoskeletal: Symmetrical with no gross deformities  Pulses:  Normal pulses noted Extremities: No clubbing, cyanosis, edema or deformities noted Neurological: Alert oriented x 4, grossly nonfocal Psychological:  Alert and cooperative. Normal mood and affect   Assessment and Recommendations:  1.  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 Diarrhea has substantially improved but has not completely resolved. Lactose intolerance.  Continue to minimize lactose products and use Lactaid prn.  Continue her review of the low FODMAP diet and avoid any foods, beverages that exacerbate diarrhea.  Florastor bid for 2 weeks and then discontinue.  If her symptoms are better controlled on Florastor she can resume it otherwise remain off Florastor. Continuue FiberCon daily if it improves her symptoms. She inquires about  oregano oil capsules and other natural antimicrobials however I do not have any information on their efficacy.  She is advised that SIBO can return and if her diarrhea has a significant return we would recommend a repeat course of antibiotics. GI follow up prn.   2. CRC screening, average risk.  A 10-year interval screening colonoscopy is recommended in July 2025.

## 2020-08-08 ENCOUNTER — Other Ambulatory Visit: Payer: Self-pay | Admitting: Internal Medicine

## 2020-08-08 DIAGNOSIS — Z1231 Encounter for screening mammogram for malignant neoplasm of breast: Secondary | ICD-10-CM

## 2020-09-11 ENCOUNTER — Ambulatory Visit
Admission: RE | Admit: 2020-09-11 | Discharge: 2020-09-11 | Disposition: A | Discharge status: Home / Self Care | Payer: Medicare Other | Source: Ambulatory Visit | Attending: Internal Medicine | Admitting: Internal Medicine

## 2020-09-11 ENCOUNTER — Other Ambulatory Visit: Payer: Self-pay

## 2020-09-11 DIAGNOSIS — Z1231 Encounter for screening mammogram for malignant neoplasm of breast: Secondary | ICD-10-CM

## 2020-10-01 ENCOUNTER — Ambulatory Visit: Payer: Medicare Other

## 2020-10-21 ENCOUNTER — Ambulatory Visit: Payer: Medicare HMO | Admitting: Allergy & Immunology

## 2022-12-11 IMAGING — MG MM DIGITAL SCREENING BILAT W/ TOMO AND CAD
6 of 10 series · 6 of 30 positions shown · non-contrast
Comparison: Previous exam(s).

ACR Breast Density Category a: The breast tissue is almost entirely
fatty.

CLINICAL DATA: Screening.

EXAM:
DIGITAL SCREENING BILATERAL MAMMOGRAM WITH TOMOSYNTHESIS AND CAD
TECHNIQUE: Bilateral screening digital craniocaudal and mediolateral oblique
mammograms were obtained. Bilateral screening digital breast
tomosynthesis was performed. The images were evaluated with
computer-aided detection.

[R MLO synth-2D (1 of 2)]
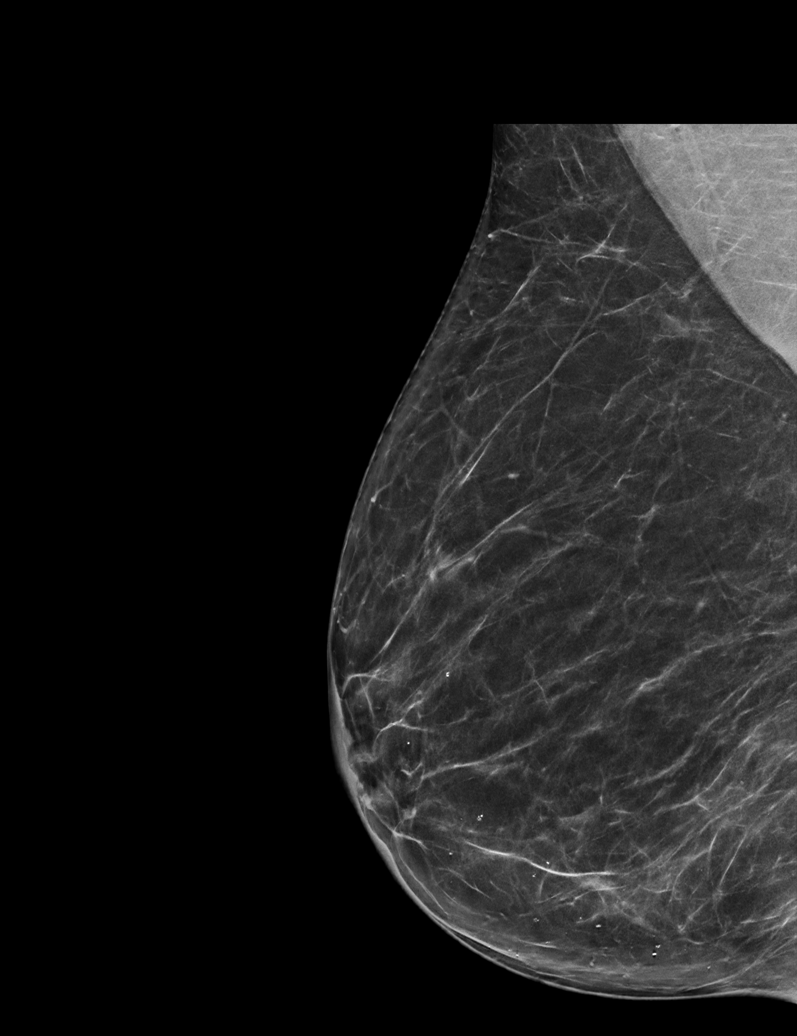

[L MLO synth-2D]
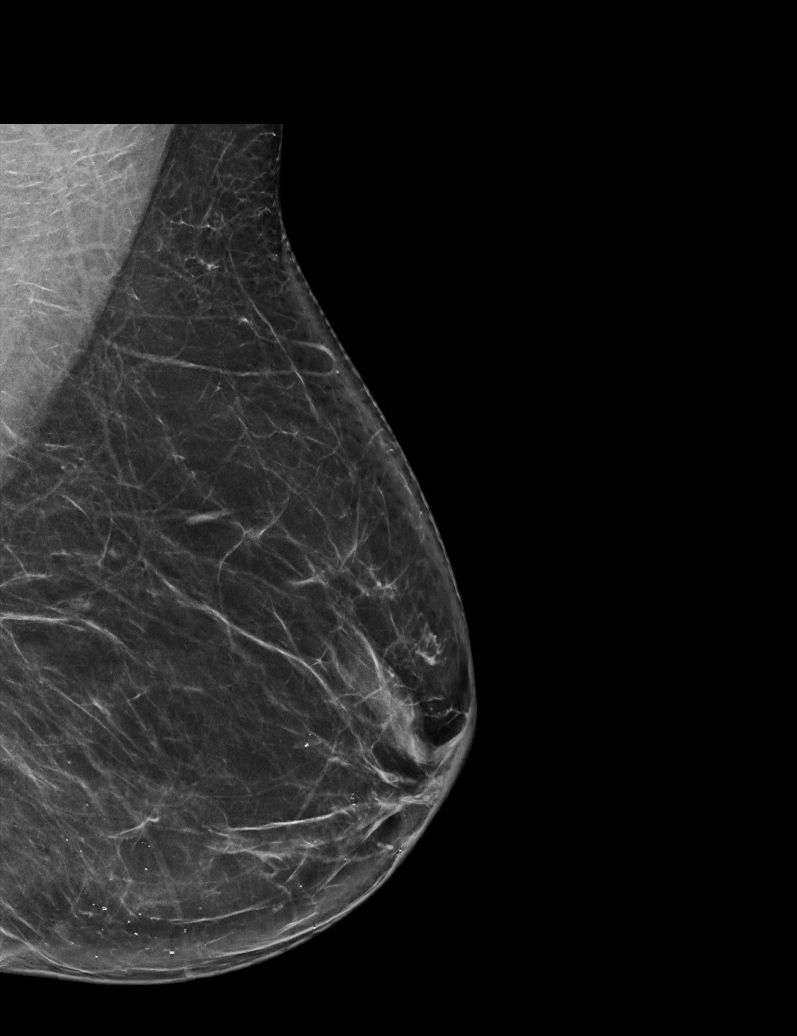

[R CC synth-2D]
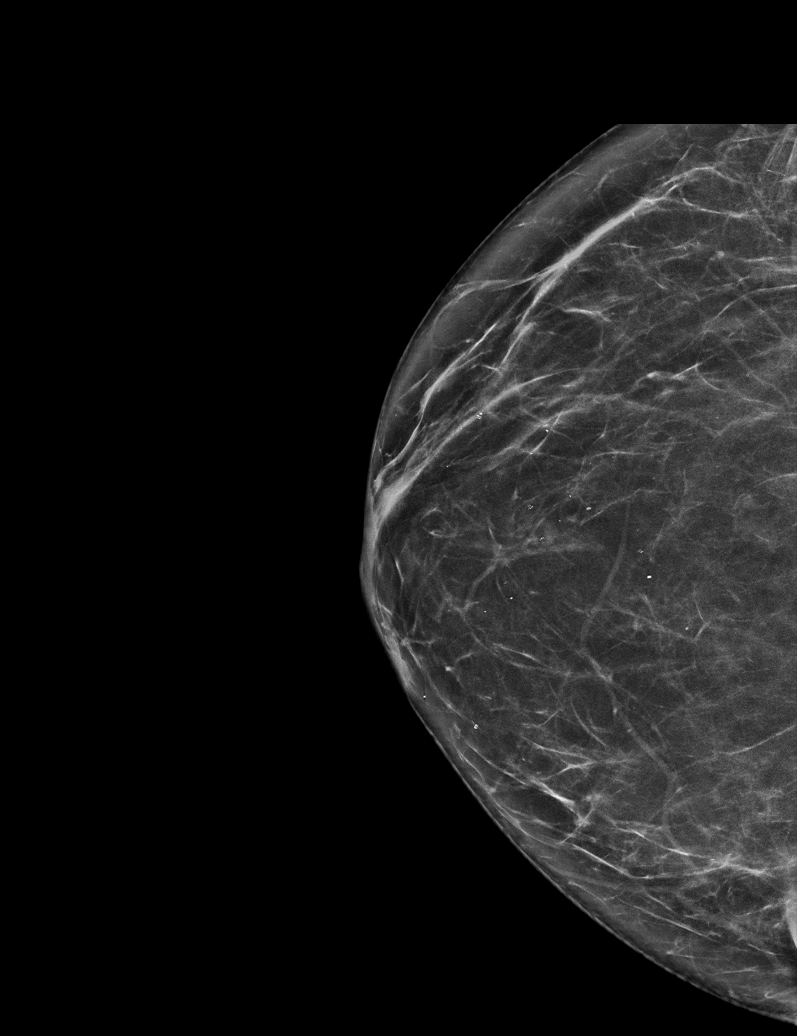

[L CC synth-2D]
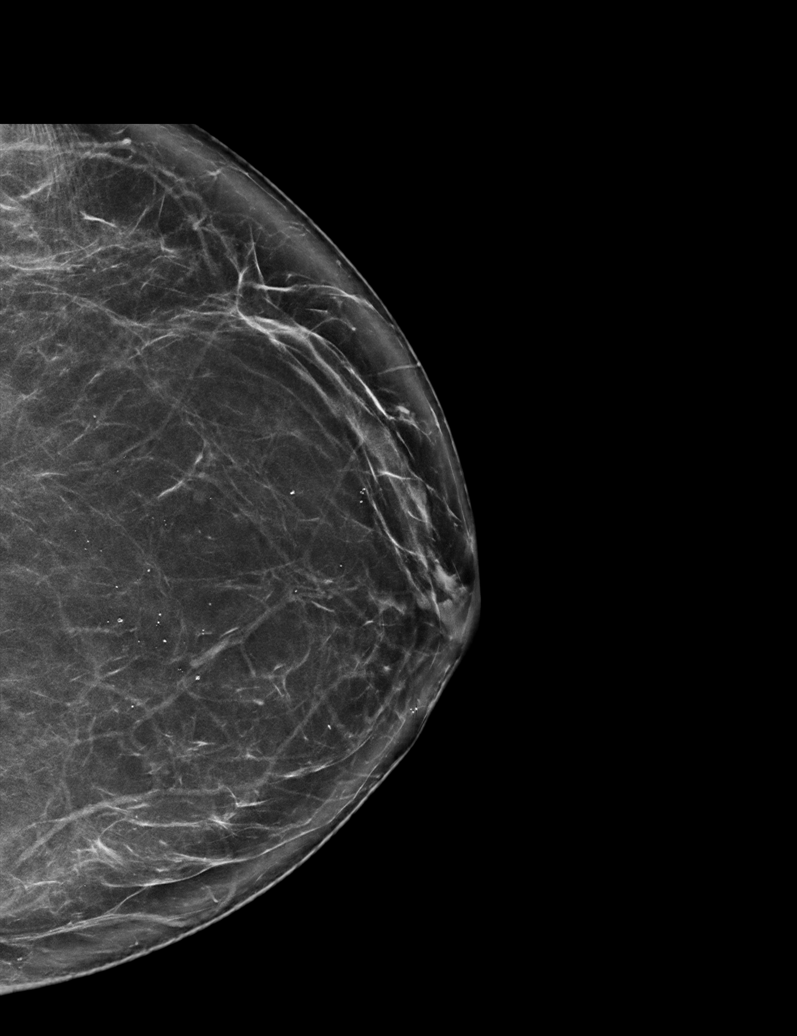

[R MLO synth-2D (2 of 2)]
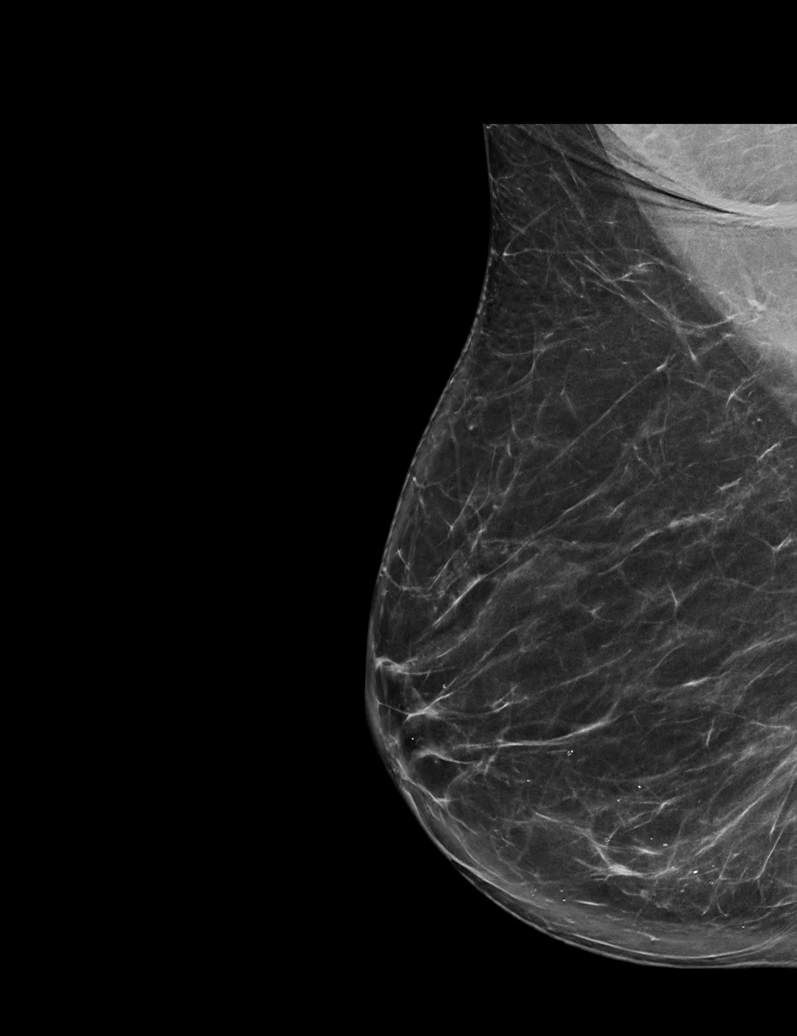

[R CC tomo · tomo slice 33/66.0]
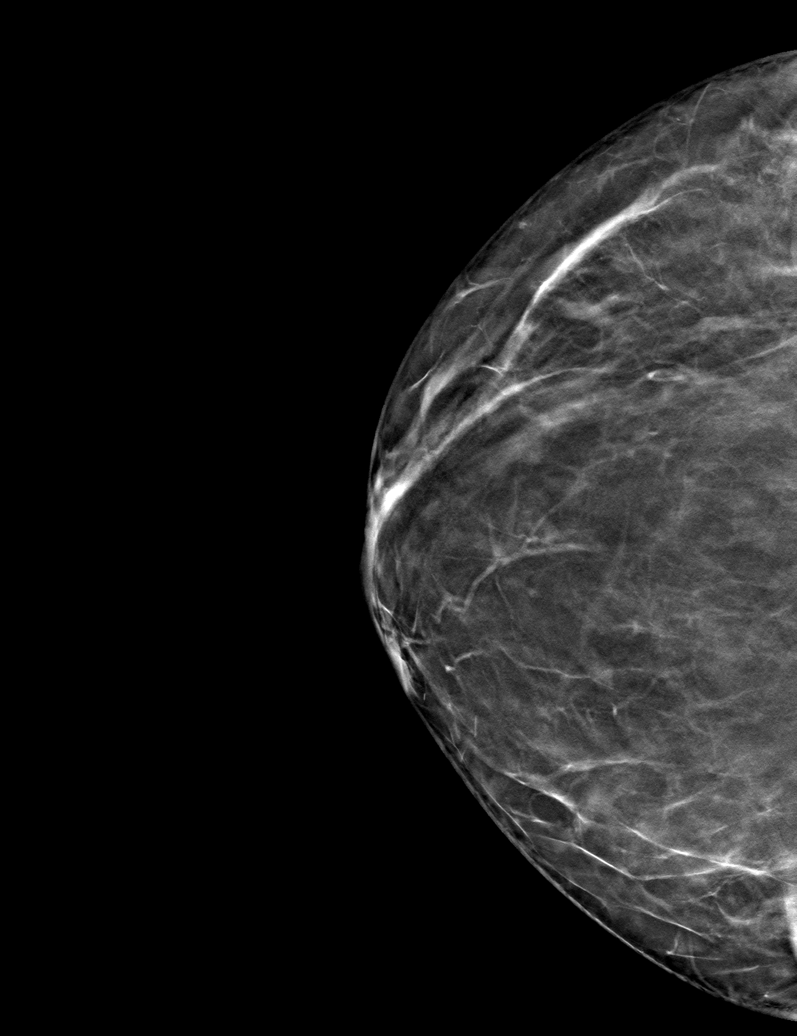

[6 of 30 positions shown; findings below may reference images not displayed]

FINDINGS: There are no findings suspicious for malignancy. The images were
evaluated with computer-aided detection.
IMPRESSION: No mammographic evidence of malignancy. A result letter of this
screening mammogram will be mailed directly to the patient.

RECOMMENDATION:
Screening mammogram in one year. (Code:JP-J-DD5)

BI-RADS CATEGORY  1: Negative.
